# Patient Record
Sex: Male | Born: 1935 | Race: White | Hispanic: No | Smoking: Never smoker
Health system: Southern US, Community
[De-identification: ages and names within clinical notes are randomized; demographics above are authoritative.]

## PROBLEM LIST (undated history)

## (undated) DIAGNOSIS — F419 Anxiety disorder, unspecified: Secondary | ICD-10-CM

## (undated) DIAGNOSIS — M1990 Unspecified osteoarthritis, unspecified site: Secondary | ICD-10-CM

## (undated) DIAGNOSIS — R55 Syncope and collapse: Secondary | ICD-10-CM

## (undated) DIAGNOSIS — K219 Gastro-esophageal reflux disease without esophagitis: Secondary | ICD-10-CM

## (undated) DIAGNOSIS — I358 Other nonrheumatic aortic valve disorders: Secondary | ICD-10-CM

## (undated) DIAGNOSIS — R001 Bradycardia, unspecified: Secondary | ICD-10-CM

## (undated) DIAGNOSIS — R5381 Other malaise: Secondary | ICD-10-CM

## (undated) DIAGNOSIS — F0390 Unspecified dementia without behavioral disturbance: Secondary | ICD-10-CM

## (undated) DIAGNOSIS — M545 Low back pain, unspecified: Secondary | ICD-10-CM

## (undated) DIAGNOSIS — I340 Nonrheumatic mitral (valve) insufficiency: Secondary | ICD-10-CM

## (undated) DIAGNOSIS — Z951 Presence of aortocoronary bypass graft: Secondary | ICD-10-CM

## (undated) DIAGNOSIS — K2100 Gastro-esophageal reflux disease with esophagitis, without bleeding: Secondary | ICD-10-CM

## (undated) DIAGNOSIS — E785 Hyperlipidemia, unspecified: Secondary | ICD-10-CM

## (undated) DIAGNOSIS — L02419 Cutaneous abscess of limb, unspecified: Secondary | ICD-10-CM

## (undated) DIAGNOSIS — R609 Edema, unspecified: Secondary | ICD-10-CM

## (undated) DIAGNOSIS — L03119 Cellulitis of unspecified part of limb: Secondary | ICD-10-CM

## (undated) DIAGNOSIS — I779 Disorder of arteries and arterioles, unspecified: Secondary | ICD-10-CM

## (undated) DIAGNOSIS — M5412 Radiculopathy, cervical region: Secondary | ICD-10-CM

## (undated) DIAGNOSIS — R269 Unspecified abnormalities of gait and mobility: Secondary | ICD-10-CM

## (undated) DIAGNOSIS — K5900 Constipation, unspecified: Secondary | ICD-10-CM

## (undated) DIAGNOSIS — IMO0002 Reserved for concepts with insufficient information to code with codable children: Secondary | ICD-10-CM

## (undated) DIAGNOSIS — I739 Peripheral vascular disease, unspecified: Secondary | ICD-10-CM

## (undated) DIAGNOSIS — I2510 Atherosclerotic heart disease of native coronary artery without angina pectoris: Secondary | ICD-10-CM

## (undated) DIAGNOSIS — Z77090 Contact with and (suspected) exposure to asbestos: Secondary | ICD-10-CM

## (undated) DIAGNOSIS — K222 Esophageal obstruction: Secondary | ICD-10-CM

## (undated) DIAGNOSIS — I452 Bifascicular block: Secondary | ICD-10-CM

## (undated) DIAGNOSIS — R9430 Abnormal result of cardiovascular function study, unspecified: Secondary | ICD-10-CM

## (undated) HISTORY — DX: Contact with and (suspected) exposure to asbestos: Z77.090

## (undated) HISTORY — DX: Radiculopathy, cervical region: M54.12

## (undated) HISTORY — DX: Gastro-esophageal reflux disease with esophagitis, without bleeding: K21.00

## (undated) HISTORY — DX: Hyperlipidemia, unspecified: E78.5

## (undated) HISTORY — PX: TRANSURETHRAL RESECTION OF PROSTATE: SHX73

## (undated) HISTORY — DX: Unspecified dementia, unspecified severity, without behavioral disturbance, psychotic disturbance, mood disturbance, and anxiety: F03.90

## (undated) HISTORY — DX: Cutaneous abscess of limb, unspecified: L02.419

## (undated) HISTORY — DX: Esophageal obstruction: K22.2

## (undated) HISTORY — DX: Anxiety disorder, unspecified: F41.9

## (undated) HISTORY — DX: Disorder of arteries and arterioles, unspecified: I77.9

## (undated) HISTORY — DX: Constipation, unspecified: K59.00

## (undated) HISTORY — DX: Cellulitis of unspecified part of limb: L03.119

## (undated) HISTORY — DX: Peripheral vascular disease, unspecified: I73.9

## (undated) HISTORY — DX: Reserved for concepts with insufficient information to code with codable children: IMO0002

## (undated) HISTORY — DX: Other nonrheumatic aortic valve disorders: I35.8

## (undated) HISTORY — DX: Edema, unspecified: R60.9

## (undated) HISTORY — PX: OTHER SURGICAL HISTORY: SHX169

## (undated) HISTORY — DX: Atherosclerotic heart disease of native coronary artery without angina pectoris: I25.10

## (undated) HISTORY — DX: Other malaise: R53.81

## (undated) HISTORY — DX: Gastro-esophageal reflux disease with esophagitis: K21.0

## (undated) HISTORY — DX: Abnormal result of cardiovascular function study, unspecified: R94.30

## (undated) HISTORY — DX: Syncope and collapse: R55

## (undated) HISTORY — DX: Bifascicular block: I45.2

## (undated) HISTORY — DX: Presence of aortocoronary bypass graft: Z95.1

## (undated) HISTORY — DX: Gastro-esophageal reflux disease without esophagitis: K21.9

## (undated) HISTORY — DX: Nonrheumatic mitral (valve) insufficiency: I34.0

## (undated) HISTORY — DX: Unspecified osteoarthritis, unspecified site: M19.90

## (undated) HISTORY — DX: Low back pain, unspecified: M54.50

## (undated) HISTORY — DX: Bradycardia, unspecified: R00.1

## (undated) HISTORY — DX: Unspecified abnormalities of gait and mobility: R26.9

## (undated) HISTORY — DX: Low back pain: M54.5

---

## 1983-01-06 HISTORY — PX: CORONARY ARTERY BYPASS GRAFT: SHX141

## 1997-04-07 ENCOUNTER — Encounter (HOSPITAL_COMMUNITY): Admission: RE | Admit: 1997-04-07 | Discharge: 1997-07-06 | Payer: Self-pay | Admitting: *Deleted

## 1997-07-02 ENCOUNTER — Inpatient Hospital Stay (HOSPITAL_COMMUNITY): Admission: EM | Admit: 1997-07-02 | Discharge: 1997-07-02 | Payer: Self-pay | Admitting: Emergency Medicine

## 1997-07-05 ENCOUNTER — Encounter (HOSPITAL_COMMUNITY): Admission: RE | Admit: 1997-07-05 | Discharge: 1997-10-03 | Payer: Self-pay | Admitting: *Deleted

## 1997-10-05 ENCOUNTER — Encounter (HOSPITAL_COMMUNITY): Admission: RE | Admit: 1997-10-05 | Discharge: 1998-01-03 | Payer: Self-pay | Admitting: *Deleted

## 1998-01-04 ENCOUNTER — Encounter (HOSPITAL_COMMUNITY): Admission: RE | Admit: 1998-01-04 | Discharge: 1998-04-04 | Payer: Self-pay | Admitting: *Deleted

## 1998-04-05 ENCOUNTER — Encounter (HOSPITAL_COMMUNITY): Admission: RE | Admit: 1998-04-05 | Discharge: 1998-07-04 | Payer: Self-pay | Admitting: *Deleted

## 1998-07-05 ENCOUNTER — Encounter (HOSPITAL_COMMUNITY): Admission: RE | Admit: 1998-07-05 | Discharge: 1998-08-05 | Payer: Self-pay | Admitting: *Deleted

## 1998-08-06 ENCOUNTER — Encounter (HOSPITAL_COMMUNITY): Admission: RE | Admit: 1998-08-06 | Discharge: 1998-11-04 | Payer: Self-pay | Admitting: *Deleted

## 1998-08-27 ENCOUNTER — Encounter: Payer: Self-pay | Admitting: Gastroenterology

## 1999-01-06 HISTORY — PX: OTHER SURGICAL HISTORY: SHX169

## 1999-02-20 ENCOUNTER — Ambulatory Visit (HOSPITAL_COMMUNITY): Admission: RE | Admit: 1999-02-20 | Discharge: 1999-02-21 | Payer: Self-pay

## 2001-12-27 ENCOUNTER — Ambulatory Visit (HOSPITAL_COMMUNITY): Admission: RE | Admit: 2001-12-27 | Discharge: 2001-12-27 | Payer: Self-pay | Admitting: Rheumatology

## 2001-12-27 ENCOUNTER — Encounter: Payer: Self-pay | Admitting: Rheumatology

## 2002-01-05 DIAGNOSIS — K222 Esophageal obstruction: Secondary | ICD-10-CM

## 2002-01-05 HISTORY — DX: Esophageal obstruction: K22.2

## 2002-04-06 HISTORY — PX: OTHER SURGICAL HISTORY: SHX169

## 2002-04-20 ENCOUNTER — Encounter: Payer: Self-pay | Admitting: Orthopedic Surgery

## 2002-04-21 ENCOUNTER — Ambulatory Visit (HOSPITAL_COMMUNITY): Admission: RE | Admit: 2002-04-21 | Discharge: 2002-04-21 | Payer: Self-pay | Admitting: Pulmonary Disease

## 2002-04-21 ENCOUNTER — Encounter: Payer: Self-pay | Admitting: Pulmonary Disease

## 2002-04-24 ENCOUNTER — Inpatient Hospital Stay (HOSPITAL_COMMUNITY): Admission: RE | Admit: 2002-04-24 | Discharge: 2002-04-28 | Payer: Self-pay | Admitting: Orthopedic Surgery

## 2002-04-24 ENCOUNTER — Encounter: Payer: Self-pay | Admitting: Orthopedic Surgery

## 2002-05-01 ENCOUNTER — Emergency Department (HOSPITAL_COMMUNITY): Admission: EM | Admit: 2002-05-01 | Discharge: 2002-05-01 | Payer: Self-pay | Admitting: Emergency Medicine

## 2002-05-01 ENCOUNTER — Encounter: Payer: Self-pay | Admitting: Emergency Medicine

## 2002-05-01 ENCOUNTER — Inpatient Hospital Stay (HOSPITAL_COMMUNITY): Admission: EM | Admit: 2002-05-01 | Discharge: 2002-05-11 | Payer: Self-pay | Admitting: *Deleted

## 2002-05-02 ENCOUNTER — Encounter: Payer: Self-pay | Admitting: Cardiology

## 2002-05-02 ENCOUNTER — Encounter: Payer: Self-pay | Admitting: *Deleted

## 2002-05-03 ENCOUNTER — Encounter: Payer: Self-pay | Admitting: Orthopedic Surgery

## 2002-05-04 ENCOUNTER — Encounter: Payer: Self-pay | Admitting: Infectious Diseases

## 2002-05-04 ENCOUNTER — Encounter: Payer: Self-pay | Admitting: Gastroenterology

## 2004-01-21 ENCOUNTER — Ambulatory Visit: Payer: Self-pay | Admitting: *Deleted

## 2004-01-29 ENCOUNTER — Ambulatory Visit: Payer: Self-pay | Admitting: *Deleted

## 2004-02-13 ENCOUNTER — Ambulatory Visit: Payer: Self-pay

## 2004-04-08 ENCOUNTER — Ambulatory Visit: Payer: Self-pay | Admitting: Pulmonary Disease

## 2004-05-08 ENCOUNTER — Ambulatory Visit: Payer: Self-pay | Admitting: Gastroenterology

## 2004-06-03 ENCOUNTER — Ambulatory Visit: Payer: Self-pay | Admitting: *Deleted

## 2004-07-23 ENCOUNTER — Ambulatory Visit: Payer: Self-pay | Admitting: *Deleted

## 2004-11-13 ENCOUNTER — Ambulatory Visit: Payer: Self-pay | Admitting: Pulmonary Disease

## 2005-01-12 ENCOUNTER — Ambulatory Visit: Payer: Self-pay | Admitting: *Deleted

## 2005-01-20 ENCOUNTER — Ambulatory Visit: Payer: Self-pay | Admitting: *Deleted

## 2005-02-20 ENCOUNTER — Ambulatory Visit: Payer: Self-pay

## 2005-04-06 ENCOUNTER — Ambulatory Visit: Payer: Self-pay | Admitting: Pulmonary Disease

## 2005-04-15 ENCOUNTER — Ambulatory Visit: Payer: Self-pay | Admitting: Gastroenterology

## 2005-05-07 ENCOUNTER — Ambulatory Visit: Payer: Self-pay | Admitting: Gastroenterology

## 2005-06-30 ENCOUNTER — Ambulatory Visit: Payer: Self-pay | Admitting: *Deleted

## 2005-07-06 ENCOUNTER — Ambulatory Visit: Payer: Self-pay | Admitting: *Deleted

## 2005-10-27 ENCOUNTER — Ambulatory Visit: Payer: Self-pay | Admitting: *Deleted

## 2005-10-27 LAB — CONVERTED CEMR LAB
AST: 25 units/L (ref 0–37)
Albumin: 3.8 g/dL (ref 3.5–5.2)
Alkaline Phosphatase: 47 units/L (ref 39–117)
Chol/HDL Ratio, serum: 3.8
Creatinine, Ser: 1.1 mg/dL (ref 0.4–1.5)
GFR calc non Af Amer: 67 mL/min
HDL: 46.4 mg/dL (ref >39.0)
LDL Cholesterol: 117 mg/dL — ABNORMAL HIGH (ref 0–99)
Potassium: 4.1 meq/L (ref 3.5–5.1)
Sodium: 141 meq/L (ref 135–145)
Total Bilirubin: 1 mg/dL (ref 0.3–1.2)
Triglyceride fasting, serum: 61 mg/dL (ref 0–149)
VLDL: 12 mg/dL (ref 0–40)

## 2005-11-10 ENCOUNTER — Ambulatory Visit: Payer: Self-pay | Admitting: *Deleted

## 2005-12-08 ENCOUNTER — Ambulatory Visit: Payer: Self-pay | Admitting: *Deleted

## 2005-12-23 ENCOUNTER — Ambulatory Visit: Payer: Self-pay

## 2006-01-27 ENCOUNTER — Ambulatory Visit: Payer: Self-pay | Admitting: Pulmonary Disease

## 2006-02-10 ENCOUNTER — Ambulatory Visit: Payer: Self-pay | Admitting: *Deleted

## 2006-02-10 ENCOUNTER — Ambulatory Visit: Payer: Self-pay

## 2006-02-10 LAB — CONVERTED CEMR LAB
ALT: 27 U/L
AST: 30 U/L
Albumin: 3.5 g/dL
Alkaline Phosphatase: 54 U/L
Bilirubin, Direct: 0.2 mg/dL
Cholesterol: 136 mg/dL
HDL: 46 mg/dL
LDL Cholesterol: 81 mg/dL
Total Bilirubin: 0.9 mg/dL
Total CHOL/HDL Ratio: 3
Total Protein: 6.5 g/dL
Triglycerides: 44 mg/dL
VLDL: 9 mg/dL

## 2006-04-29 ENCOUNTER — Ambulatory Visit: Payer: Self-pay | Admitting: *Deleted

## 2006-04-29 LAB — CONVERTED CEMR LAB
BUN: 19 mg/dL (ref 6–23)
GFR calc non Af Amer: 67 mL/min
Glucose, Bld: 70 mg/dL (ref 70–99)
Sodium: 140 meq/L (ref 135–145)

## 2006-07-27 ENCOUNTER — Ambulatory Visit: Payer: Self-pay | Admitting: *Deleted

## 2006-07-27 LAB — CONVERTED CEMR LAB
ALT: 25 units/L (ref 0–53)
AST: 26 units/L (ref 0–37)
Albumin: 3.8 g/dL (ref 3.5–5.2)
Bilirubin, Direct: 0.2 mg/dL (ref 0.0–0.3)
Cholesterol: 134 mg/dL (ref 0–200)
HDL: 46.9 mg/dL (ref >39.0)
LDL Cholesterol: 77 mg/dL (ref 0–99)
Total Bilirubin: 1.3 mg/dL — ABNORMAL HIGH (ref 0.3–1.2)
VLDL: 10 mg/dL (ref 0–40)

## 2006-08-05 ENCOUNTER — Ambulatory Visit: Payer: Self-pay | Admitting: Cardiology

## 2006-11-02 ENCOUNTER — Ambulatory Visit: Payer: Self-pay | Admitting: Cardiology

## 2007-01-07 DIAGNOSIS — M1990 Unspecified osteoarthritis, unspecified site: Secondary | ICD-10-CM | POA: Insufficient documentation

## 2007-01-07 DIAGNOSIS — E785 Hyperlipidemia, unspecified: Secondary | ICD-10-CM

## 2007-01-10 ENCOUNTER — Ambulatory Visit: Payer: Self-pay | Admitting: Pulmonary Disease

## 2007-01-10 DIAGNOSIS — F411 Generalized anxiety disorder: Secondary | ICD-10-CM | POA: Insufficient documentation

## 2007-01-10 DIAGNOSIS — J309 Allergic rhinitis, unspecified: Secondary | ICD-10-CM | POA: Insufficient documentation

## 2007-01-10 DIAGNOSIS — M545 Low back pain: Secondary | ICD-10-CM

## 2007-01-10 DIAGNOSIS — R109 Unspecified abdominal pain: Secondary | ICD-10-CM

## 2007-01-10 DIAGNOSIS — R972 Elevated prostate specific antigen [PSA]: Secondary | ICD-10-CM | POA: Insufficient documentation

## 2007-01-24 ENCOUNTER — Ambulatory Visit: Payer: Self-pay | Admitting: Pulmonary Disease

## 2007-01-24 DIAGNOSIS — D126 Benign neoplasm of colon, unspecified: Secondary | ICD-10-CM

## 2007-01-24 DIAGNOSIS — K5730 Diverticulosis of large intestine without perforation or abscess without bleeding: Secondary | ICD-10-CM | POA: Insufficient documentation

## 2007-01-24 DIAGNOSIS — K209 Esophagitis, unspecified without bleeding: Secondary | ICD-10-CM | POA: Insufficient documentation

## 2007-01-24 DIAGNOSIS — R1314 Dysphagia, pharyngoesophageal phase: Secondary | ICD-10-CM

## 2007-01-24 DIAGNOSIS — Z77090 Contact with and (suspected) exposure to asbestos: Secondary | ICD-10-CM

## 2007-01-25 DIAGNOSIS — M810 Age-related osteoporosis without current pathological fracture: Secondary | ICD-10-CM | POA: Insufficient documentation

## 2007-01-25 LAB — CONVERTED CEMR LAB
BUN: 21 mg/dL (ref 6–23)
Basophils Absolute: 0.1 10*3/uL (ref 0.0–0.1)
Bilirubin, Direct: 0.2 mg/dL (ref 0.0–0.3)
CO2: 30 meq/L (ref 19–32)
Calcium: 8.9 mg/dL (ref 8.4–10.5)
Cholesterol: 137 mg/dL (ref 0–200)
Creatinine, Ser: 1 mg/dL (ref 0.4–1.5)
Eosinophils Relative: 2 % (ref 0.0–5.0)
Glucose, Bld: 76 mg/dL (ref 70–99)
HCT: 45 % (ref 39.0–52.0)
Monocytes Absolute: 0.9 10*3/uL — ABNORMAL HIGH (ref 0.2–0.7)
Neutro Abs: 4.5 10*3/uL (ref 1.4–7.7)
Neutrophils Relative %: 58.4 % (ref 43.0–77.0)
Potassium: 4.1 meq/L (ref 3.5–5.1)
Sodium: 136 meq/L (ref 135–145)
TSH: 1.18 microintl units/mL (ref 0.35–5.50)
Total CHOL/HDL Ratio: 2.8
VLDL: 9 mg/dL (ref 0–40)

## 2007-01-27 ENCOUNTER — Ambulatory Visit: Payer: Self-pay | Admitting: Cardiology

## 2007-02-08 ENCOUNTER — Encounter: Payer: Self-pay | Admitting: Adult Health

## 2007-02-17 ENCOUNTER — Ambulatory Visit: Payer: Self-pay | Admitting: Pulmonary Disease

## 2007-02-21 ENCOUNTER — Ambulatory Visit: Payer: Self-pay | Admitting: Pulmonary Disease

## 2007-03-09 ENCOUNTER — Encounter: Payer: Self-pay | Admitting: Pulmonary Disease

## 2007-03-29 ENCOUNTER — Encounter: Payer: Self-pay | Admitting: Pulmonary Disease

## 2007-04-27 ENCOUNTER — Ambulatory Visit: Payer: Self-pay | Admitting: Cardiology

## 2007-05-24 ENCOUNTER — Ambulatory Visit: Payer: Self-pay | Admitting: Internal Medicine

## 2007-05-26 ENCOUNTER — Telehealth: Payer: Self-pay | Admitting: Adult Health

## 2007-07-20 ENCOUNTER — Encounter: Payer: Self-pay | Admitting: Pulmonary Disease

## 2007-08-29 ENCOUNTER — Encounter: Payer: Self-pay | Admitting: Pulmonary Disease

## 2007-09-28 ENCOUNTER — Ambulatory Visit: Payer: Self-pay | Admitting: Cardiology

## 2007-10-05 ENCOUNTER — Ambulatory Visit: Payer: Self-pay | Admitting: Cardiology

## 2007-10-05 LAB — CONVERTED CEMR LAB
AST: 29 units/L (ref 0–37)
HDL: 46.7 mg/dL (ref >39.0)
Total Protein: 6.5 g/dL (ref 6.0–8.3)
Triglycerides: 49 mg/dL (ref 0–149)
Vitamin B-12: 707 pg/mL (ref 211–911)

## 2007-10-10 ENCOUNTER — Telehealth: Payer: Self-pay | Admitting: Gastroenterology

## 2007-10-10 ENCOUNTER — Telehealth: Payer: Self-pay | Admitting: Pulmonary Disease

## 2007-10-12 ENCOUNTER — Ambulatory Visit: Payer: Self-pay | Admitting: Gastroenterology

## 2007-10-12 LAB — CONVERTED CEMR LAB
ALT: 29 units/L (ref 0–53)
Alkaline Phosphatase: 59 units/L (ref 39–117)
CO2: 34 meq/L — ABNORMAL HIGH (ref 19–32)
Calcium: 8.7 mg/dL (ref 8.4–10.5)
Eosinophils Relative: 1.4 % (ref 0.0–5.0)
GFR calc Af Amer: 81 mL/min
HCT: 43.8 % (ref 39.0–52.0)
Hemoglobin: 15.3 g/dL (ref 13.0–17.0)
Lymphocytes Relative: 24.6 % (ref 12.0–46.0)
MCHC: 34.8 g/dL (ref 30.0–36.0)
MCV: 96.9 fL (ref 78.0–100.0)
Monocytes Absolute: 0.9 10*3/uL (ref 0.1–1.0)
Platelets: 171 10*3/uL (ref 150–400)
Potassium: 4 meq/L (ref 3.5–5.1)
RBC: 4.52 M/uL (ref 4.22–5.81)
Sed Rate: 7 mm/hr (ref 0–16)
Total Protein: 6.9 g/dL (ref 6.0–8.3)
WBC: 6.7 10*3/uL (ref 4.5–10.5)

## 2007-10-14 ENCOUNTER — Ambulatory Visit: Payer: Self-pay

## 2007-10-14 ENCOUNTER — Telehealth: Payer: Self-pay | Admitting: Gastroenterology

## 2007-10-14 ENCOUNTER — Encounter: Payer: Self-pay | Admitting: Gastroenterology

## 2007-10-17 ENCOUNTER — Telehealth: Payer: Self-pay | Admitting: Gastroenterology

## 2007-10-18 ENCOUNTER — Ambulatory Visit: Payer: Self-pay | Admitting: Cardiology

## 2007-10-21 ENCOUNTER — Telehealth: Payer: Self-pay | Admitting: Gastroenterology

## 2007-11-10 ENCOUNTER — Ambulatory Visit: Payer: Self-pay | Admitting: Cardiology

## 2007-12-06 ENCOUNTER — Ambulatory Visit: Payer: Self-pay | Admitting: Cardiology

## 2007-12-21 ENCOUNTER — Ambulatory Visit: Payer: Self-pay | Admitting: Pulmonary Disease

## 2007-12-22 ENCOUNTER — Emergency Department (HOSPITAL_COMMUNITY): Admission: EM | Admit: 2007-12-22 | Discharge: 2007-12-22 | Payer: Self-pay | Admitting: Emergency Medicine

## 2008-02-06 ENCOUNTER — Ambulatory Visit: Payer: Self-pay | Admitting: Gastroenterology

## 2008-02-08 ENCOUNTER — Telehealth (INDEPENDENT_AMBULATORY_CARE_PROVIDER_SITE_OTHER): Payer: Self-pay | Admitting: *Deleted

## 2008-04-04 ENCOUNTER — Encounter: Payer: Self-pay | Admitting: Cardiology

## 2008-04-04 ENCOUNTER — Ambulatory Visit: Payer: Self-pay | Admitting: Cardiology

## 2008-04-04 DIAGNOSIS — I959 Hypotension, unspecified: Secondary | ICD-10-CM

## 2008-04-04 DIAGNOSIS — I4510 Unspecified right bundle-branch block: Secondary | ICD-10-CM | POA: Insufficient documentation

## 2008-04-18 ENCOUNTER — Ambulatory Visit: Payer: Self-pay | Admitting: Pulmonary Disease

## 2008-04-18 DIAGNOSIS — Z87898 Personal history of other specified conditions: Secondary | ICD-10-CM

## 2008-04-24 ENCOUNTER — Ambulatory Visit: Payer: Self-pay | Admitting: Pulmonary Disease

## 2008-04-29 LAB — CONVERTED CEMR LAB
ALT: 31 units/L (ref 0–53)
Albumin: 3.7 g/dL (ref 3.5–5.2)
Alkaline Phosphatase: 52 units/L (ref 39–117)
BUN: 17 mg/dL (ref 6–23)
Calcium: 9.2 mg/dL (ref 8.4–10.5)
Chloride: 103 meq/L (ref 96–112)
Cholesterol: 170 mg/dL (ref 0–200)
Creatinine, Ser: 1 mg/dL (ref 0.4–1.5)
GFR calc non Af Amer: 74.59 mL/min (ref >60)
HCT: 42.7 % (ref 39.0–52.0)
Hemoglobin: 15 g/dL (ref 13.0–17.0)
Lymphocytes Relative: 26.8 % (ref 12.0–46.0)
Neutrophils Relative %: 56.2 % (ref 43.0–77.0)
PSA: 2.52 ng/mL (ref 0.10–4.00)
Sodium: 141 meq/L (ref 135–145)
TSH: 1.29 microintl units/mL (ref 0.35–5.50)
Total Bilirubin: 1.1 mg/dL (ref 0.3–1.2)
Total CHOL/HDL Ratio: 3
WBC: 5.6 10*3/uL (ref 4.5–10.5)

## 2008-05-04 ENCOUNTER — Telehealth: Payer: Self-pay | Admitting: Pulmonary Disease

## 2008-07-13 ENCOUNTER — Telehealth (INDEPENDENT_AMBULATORY_CARE_PROVIDER_SITE_OTHER): Payer: Self-pay | Admitting: *Deleted

## 2008-07-23 ENCOUNTER — Telehealth: Payer: Self-pay | Admitting: Gastroenterology

## 2008-07-25 ENCOUNTER — Ambulatory Visit: Payer: Self-pay | Admitting: Internal Medicine

## 2008-07-25 DIAGNOSIS — K5900 Constipation, unspecified: Secondary | ICD-10-CM | POA: Insufficient documentation

## 2008-07-25 DIAGNOSIS — K219 Gastro-esophageal reflux disease without esophagitis: Secondary | ICD-10-CM

## 2008-07-25 DIAGNOSIS — K222 Esophageal obstruction: Secondary | ICD-10-CM

## 2008-08-20 ENCOUNTER — Telehealth: Payer: Self-pay | Admitting: Gastroenterology

## 2008-08-23 ENCOUNTER — Ambulatory Visit: Payer: Self-pay | Admitting: Gastroenterology

## 2008-08-24 ENCOUNTER — Ambulatory Visit: Payer: Self-pay | Admitting: Gastroenterology

## 2008-08-27 ENCOUNTER — Telehealth: Payer: Self-pay | Admitting: Gastroenterology

## 2008-08-28 ENCOUNTER — Telehealth: Payer: Self-pay | Admitting: Gastroenterology

## 2008-09-03 LAB — CONVERTED CEMR LAB: Fecal Occult Bld: POSITIVE

## 2008-09-04 ENCOUNTER — Encounter: Payer: Self-pay | Admitting: Cardiology

## 2008-09-05 ENCOUNTER — Ambulatory Visit: Payer: Self-pay | Admitting: Cardiology

## 2008-09-06 ENCOUNTER — Encounter: Payer: Self-pay | Admitting: Gastroenterology

## 2008-09-06 ENCOUNTER — Telehealth: Payer: Self-pay | Admitting: Gastroenterology

## 2008-09-11 ENCOUNTER — Ambulatory Visit: Payer: Self-pay | Admitting: Gastroenterology

## 2008-09-11 ENCOUNTER — Telehealth: Payer: Self-pay | Admitting: Gastroenterology

## 2008-09-11 LAB — CONVERTED CEMR LAB: Fecal Occult Bld: NEGATIVE

## 2008-09-21 ENCOUNTER — Telehealth: Payer: Self-pay | Admitting: Gastroenterology

## 2008-09-26 ENCOUNTER — Telehealth: Payer: Self-pay | Admitting: Gastroenterology

## 2008-10-05 ENCOUNTER — Ambulatory Visit: Payer: Self-pay | Admitting: Pulmonary Disease

## 2008-10-08 ENCOUNTER — Telehealth: Payer: Self-pay | Admitting: Gastroenterology

## 2008-10-15 ENCOUNTER — Telehealth (INDEPENDENT_AMBULATORY_CARE_PROVIDER_SITE_OTHER): Payer: Self-pay | Admitting: *Deleted

## 2008-10-18 ENCOUNTER — Telehealth: Payer: Self-pay | Admitting: Cardiology

## 2008-10-22 ENCOUNTER — Ambulatory Visit: Payer: Self-pay | Admitting: Pulmonary Disease

## 2008-10-25 ENCOUNTER — Ambulatory Visit: Payer: Self-pay | Admitting: Gastroenterology

## 2008-10-29 ENCOUNTER — Telehealth: Payer: Self-pay | Admitting: Gastroenterology

## 2008-11-05 ENCOUNTER — Telehealth: Payer: Self-pay | Admitting: Gastroenterology

## 2008-12-14 ENCOUNTER — Telehealth: Payer: Self-pay | Admitting: Gastroenterology

## 2009-01-07 ENCOUNTER — Telehealth: Payer: Self-pay | Admitting: Cardiology

## 2009-02-01 ENCOUNTER — Telehealth: Payer: Self-pay | Admitting: Cardiology

## 2009-03-28 ENCOUNTER — Ambulatory Visit: Payer: Self-pay | Admitting: Cardiology

## 2009-04-19 ENCOUNTER — Telehealth: Payer: Self-pay | Admitting: Pulmonary Disease

## 2009-04-22 ENCOUNTER — Ambulatory Visit: Payer: Self-pay | Admitting: Pulmonary Disease

## 2009-04-29 ENCOUNTER — Ambulatory Visit: Payer: Self-pay | Admitting: Pulmonary Disease

## 2009-04-29 LAB — CONVERTED CEMR LAB
ALT: 28 units/L (ref 0–53)
AST: 26 units/L (ref 0–37)
Alkaline Phosphatase: 47 units/L (ref 39–117)
Basophils Absolute: 0 10*3/uL (ref 0.0–0.1)
Basophils Relative: 0.3 % (ref 0.0–3.0)
Bilirubin, Direct: 0.1 mg/dL (ref 0.0–0.3)
CO2: 31 meq/L (ref 19–32)
Calcium: 9.1 mg/dL (ref 8.4–10.5)
Chloride: 104 meq/L (ref 96–112)
Creatinine, Ser: 1.1 mg/dL (ref 0.4–1.5)
Eosinophils Absolute: 0.2 10*3/uL (ref 0.0–0.7)
GFR calc non Af Amer: 66.68 mL/min (ref >60)
MCV: 97.4 fL (ref 78.0–100.0)
Monocytes Absolute: 0.9 10*3/uL (ref 0.1–1.0)
Monocytes Relative: 12.6 % — ABNORMAL HIGH (ref 3.0–12.0)
Neutro Abs: 4.1 10*3/uL (ref 1.4–7.7)
PSA: 3.17 ng/mL (ref 0.10–4.00)
TSH: 1.68 microintl units/mL (ref 0.35–5.50)
Total CHOL/HDL Ratio: 3
Triglycerides: 49 mg/dL (ref 0.0–149.0)
WBC: 7.3 10*3/uL (ref 4.5–10.5)

## 2009-05-14 ENCOUNTER — Encounter: Payer: Self-pay | Admitting: Pulmonary Disease

## 2009-05-26 ENCOUNTER — Inpatient Hospital Stay (HOSPITAL_COMMUNITY): Admission: EM | Admit: 2009-05-26 | Discharge: 2009-05-27 | Payer: Self-pay | Admitting: Emergency Medicine

## 2009-05-26 ENCOUNTER — Ambulatory Visit: Payer: Self-pay | Admitting: Internal Medicine

## 2009-05-26 ENCOUNTER — Encounter: Payer: Self-pay | Admitting: Cardiology

## 2009-05-27 ENCOUNTER — Encounter: Payer: Self-pay | Admitting: Cardiology

## 2009-05-31 ENCOUNTER — Encounter: Payer: Self-pay | Admitting: Cardiology

## 2009-06-04 ENCOUNTER — Ambulatory Visit: Payer: Self-pay | Admitting: Cardiology

## 2009-06-14 ENCOUNTER — Telehealth: Payer: Self-pay | Admitting: Cardiology

## 2009-06-17 ENCOUNTER — Telehealth: Payer: Self-pay | Admitting: Adult Health

## 2009-06-18 ENCOUNTER — Ambulatory Visit: Payer: Self-pay | Admitting: Pulmonary Disease

## 2009-06-24 ENCOUNTER — Telehealth (INDEPENDENT_AMBULATORY_CARE_PROVIDER_SITE_OTHER): Payer: Self-pay | Admitting: *Deleted

## 2009-06-25 ENCOUNTER — Encounter: Payer: Self-pay | Admitting: Pulmonary Disease

## 2009-08-25 ENCOUNTER — Emergency Department (HOSPITAL_COMMUNITY): Admission: EM | Admit: 2009-08-25 | Discharge: 2009-08-26 | Payer: Self-pay | Admitting: Emergency Medicine

## 2009-09-04 ENCOUNTER — Telehealth: Payer: Self-pay | Admitting: Pulmonary Disease

## 2009-09-04 ENCOUNTER — Ambulatory Visit: Payer: Self-pay | Admitting: Cardiology

## 2009-09-05 ENCOUNTER — Ambulatory Visit: Payer: Self-pay | Admitting: Pulmonary Disease

## 2009-09-19 ENCOUNTER — Telehealth (INDEPENDENT_AMBULATORY_CARE_PROVIDER_SITE_OTHER): Payer: Self-pay | Admitting: *Deleted

## 2009-09-19 DIAGNOSIS — R5381 Other malaise: Secondary | ICD-10-CM

## 2009-09-19 DIAGNOSIS — R5383 Other fatigue: Secondary | ICD-10-CM

## 2009-09-26 ENCOUNTER — Ambulatory Visit: Payer: Self-pay | Admitting: Vascular Surgery

## 2009-09-26 ENCOUNTER — Encounter: Payer: Self-pay | Admitting: Cardiology

## 2009-09-26 ENCOUNTER — Encounter: Payer: Self-pay | Admitting: Pulmonary Disease

## 2009-09-26 ENCOUNTER — Ambulatory Visit: Payer: Self-pay

## 2009-09-27 ENCOUNTER — Encounter: Payer: Self-pay | Admitting: Pulmonary Disease

## 2009-10-03 ENCOUNTER — Telehealth: Payer: Self-pay | Admitting: Cardiology

## 2009-10-21 ENCOUNTER — Telehealth (INDEPENDENT_AMBULATORY_CARE_PROVIDER_SITE_OTHER): Payer: Self-pay | Admitting: *Deleted

## 2009-10-22 ENCOUNTER — Ambulatory Visit: Payer: Self-pay | Admitting: Adult Health

## 2009-10-22 ENCOUNTER — Encounter: Payer: Self-pay | Admitting: Adult Health

## 2009-10-22 ENCOUNTER — Ambulatory Visit: Payer: Self-pay | Admitting: Pulmonary Disease

## 2009-10-22 LAB — CONVERTED CEMR LAB
Bilirubin Urine: NEGATIVE
Nitrite: NEGATIVE
Urine Glucose: NEGATIVE mg/dL
Urobilinogen, UA: 0.2 (ref 0.0–1.0)

## 2009-10-24 ENCOUNTER — Encounter: Payer: Self-pay | Admitting: Pulmonary Disease

## 2009-10-25 ENCOUNTER — Telehealth: Payer: Self-pay | Admitting: Adult Health

## 2009-12-02 ENCOUNTER — Encounter: Payer: Self-pay | Admitting: Pulmonary Disease

## 2009-12-04 ENCOUNTER — Encounter: Payer: Self-pay | Admitting: Pulmonary Disease

## 2009-12-17 ENCOUNTER — Encounter: Payer: Self-pay | Admitting: Cardiology

## 2009-12-18 ENCOUNTER — Encounter: Payer: Self-pay | Admitting: Pulmonary Disease

## 2009-12-19 ENCOUNTER — Ambulatory Visit: Payer: Self-pay | Admitting: Cardiology

## 2010-01-23 ENCOUNTER — Ambulatory Visit: Admit: 2010-01-23 | Payer: Self-pay | Admitting: Pulmonary Disease

## 2010-01-26 ENCOUNTER — Encounter: Payer: Self-pay | Admitting: Pulmonary Disease

## 2010-01-29 ENCOUNTER — Ambulatory Visit
Admission: RE | Admit: 2010-01-29 | Discharge: 2010-01-29 | Payer: Self-pay | Source: Home / Self Care | Attending: Sports Medicine | Admitting: Sports Medicine

## 2010-01-29 DIAGNOSIS — M79609 Pain in unspecified limb: Secondary | ICD-10-CM | POA: Insufficient documentation

## 2010-02-04 ENCOUNTER — Encounter: Payer: Self-pay | Admitting: Pulmonary Disease

## 2010-02-04 NOTE — Miscellaneous (Signed)
Summary: DG CHEST LEFT DECUBITUS / CT CHEST W/O CM  DG CHEST LEFT DECUBITUS / CT CHEST W/O CM   Imported By: Roderic Ovens 06/25/2009 11:27:20  _____________________________________________________________________  External Attachment:    Type:   Image     Comment:   External Document

## 2010-02-04 NOTE — Assessment & Plan Note (Signed)
Summary: eph/jml  Medications Added COLACE 100 MG CAPS (DOCUSATE SODIUM) as directed      Allergies Added: NKDA  Visit Type:  Follow-up Primary Provider:  Alroy Dust, MD  CC:  pre-syncope.  History of Present Illness: the patient is seen post hospitalization with an episode of syncope.  He was at a friend's home for a Saturday evening dinner and fund raiser.  I happened to be there.  The patient had an difficult day with constipation and his hemorrhoids were irritated.  He did not feel well in general and then became diaphoretic.  His pulse was weak but present.  He had his usual resting bradycardia.  Related down to the floor and is pulse became more full.  His mentation had been slightly decreased in the sitting position and this returned to normal.  He did not have chest pain.  I decided that he should be taken to the hospital for observation and this was done by ambulance.  In the hospital he was monitored and had no more arrhythmias.  He was found to be significantly constipated.  This was treated and he is improved.  Further cardiac workup was not needed.  He is now on an excellent regimen for his GI tract..  Current Medications (verified): 1)  Clarinex 5 Mg Tabs (Desloratadine) .... Take 1 Tablet By Mouth Once A Day 2)  Bayer Low Strength 81 Mg  Tbec (Aspirin) .... Take 1 Tablet By Mouth Once A Day 3)  Isosorbide Mononitrate Cr 30 Mg Tb24 (Isosorbide Mononitrate) .... Take 1/2 Tablets By Mouth Two Times A Day 4)  Metoprolol Succinate 25 Mg Xr24h-Tab (Metoprolol Succinate) .... Take 1 Tab By Mouth Once Daily.Marland KitchenMarland Kitchen 5)  Lipitor 20 Mg Tabs (Atorvastatin Calcium) .... Take 1/2 Tablets By Mouth Once Daily 6)  Fish Oil   Oil (Fish Oil) .... 2000mg  Once Daily 7)  Co Q-10 200 Mg  Caps (Coenzyme Q10) .... Take 1 Tabs By Mouth Once Daily 8)  Align  Caps (Probiotic Product) .... One Tablet By Mouth Once Daily 9)  Miralax  Powd (Polyethylene Glycol 3350) .[name].. 1 Capful in Water Daily 10)   Glucosamine-Chondroitin-Msm 500-250-250 Mg  Caps (Glucosamine-Chondroitin-Msm) .[name].. 1 Tablet Three Times A Day 11)  Multivitamins   Tabs (Multiple Vitamin) .... Take 1 Tablet By Mouth Once A Day 12)  Vitamin D3 2000 Unit Caps (Cholecalciferol) .... Once Daily 13)  Ca Mag & Phosp .... Take 1 Tablet By Mouth 4 Times Per Day. 14)  Colace 100 Mg Caps (Docusate Sodium) .... As Directed  Allergies (verified): No Known Drug Allergies  Past History:  Past Medical History: ALLERGIC RHINITIS (ICD-477.9) HISTORY OF ASBESTOS EXPOSURE (ICD-V15.84) CAD (ICD-414.00).Marland KitchenMarland KitchenMyoview scan December 2 007.. question mild ischemia at the base of the anterolateral wall CABG 1985 - Echo 2004 EF 55-65%....  Right bundle branch block - Bradycardia- mild, tolerates low-dose beta blocker. CEREBROVASCULAR DISEASE (ICD-437.9 - Carotid artery disease.Marland KitchenMarland KitchenDoppler 2007.[name] 40-59% RICA-0-39% LICA GERD /HX OF ESOPHAGEAL STRICTURE2004 BENIGN PROSTATIC HYPERTROPHY, HX OF (ICD-V13.8) OSTEOARTHRITIS (ICD-715.90) OSTEOPOROSIS (ICD-733.00) LOW BACK PAIN SYNDROME (ICD-724.2) ANXIETY (ICD-300.00) Presyncope.....[facility]... May 26, 2009... probably vasovagal...discomfort from constipation  Vital Signs:  Patient profile:   77 year old male Height:      64 inches Weight:      131 pounds BMI:     22.57 Pulse rate:   67 / minute Resp:     16 per minute BP sitting:   126 / 68  (left arm)  Vitals Entered By: Marrion Coy, CNA (  Jun 04, 2009 3:52 PM)  Physical Exam  General:  patient is stable today. Eyes:  no xanthelasma. Neck:  no jugular venous extension. Lungs:  lungs are clear.  Respiratory effort is nonlabored. Heart:  cardiac exam reveals an S1-S2.  There are no clicks or significant murmurs Abdomen:  abdomen is soft. Extremities:  no peripheral edema. Psych:  patient is oriented to person time and place.  Affect is normal.   Impression & Recommendations:  Problem # 1:  * PRESYNCOPE We believe that this episode was  related to discomfort from his constipation and hemorrhoids. He is stable now.  No further workup.  Problem # 2:  CONSTIPATION (ICD-564.00) This is now improved.  Problem # 3:  RBBB (ICD-426.4)  His updated medication list for this problem includes:    Bayer Low Strength 81 Mg Tbec (Aspirin) .[name]... Take 1 tablet by mouth once a day    Isosorbide Mononitrate Cr 30 Mg Tb24 (Isosorbide mononitrate) .[name]... Take 1/2 tablets by mouth two times a day    Metoprolol Succinate 25 Mg Xr24h-tab (Metoprolol succinate) .[name]... Take 1 tab by mouth once daily... EKG is done today and shows right bundle branch block.  There is normal sinus rhythm.  Problem # 4:  CAD (ICD-414.00)  His updated medication list for this problem includes:    Bayer Low Strength 81 Mg Tbec (Aspirin) .[name]... Take 1 tablet by mouth once a day    Isosorbide Mononitrate Cr 30 Mg Tb24 (Isosorbide mononitrate) .[name]... Take 1/2 tablets by mouth two times a day    Metoprolol Succinate 25 Mg Xr24h-tab (Metoprolol succinate) .[name]... Take 1 tab by mouth once daily... Coronary disease is stable.  No further workup.  Other Orders: EKG w/ Interpretation (93000)  Patient Instructions: 1)  Follow up in 3 months

## 2010-02-04 NOTE — Miscellaneous (Signed)
Summary: Order & Plan of Care / [employer] [facility]  Order & Plan of Care / [facility]   Imported By: Lennie Odor 12/05/2009 16:22:28  _____________________________________________________________________  External Attachment:    Type:   Image     Comment:   External Document
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
## 2010-02-04 NOTE — Progress Notes (Signed)
Summary: pain  Phone Note Call from Patient   Caller: Patient Call For: nadel Summary of Call: pt have pain in left leg below the groin area Initial call taken by: Rickard Patience,  June 17, 2009 8:49 AM  Follow-up for Phone Call        Pt c/o sharp pain in left thight area when he bends his leg. this started yesterday. he denies any redness to the area, no knots or bruising either. Pt requesitng an appt tomorrow with SN of TP. Spoke to [employer]. and she ok placeing pt in 11 am slot. Pt aware of appt. Carron Curie CMA  June 17, 2009 9:46 AM

## 2010-02-04 NOTE — Progress Notes (Signed)
Summary: blood work   Phone Note Call from Patient Call back at [employer] [phone]   Caller: Patient Summary of Call: does he need blood work prior to appt, appt 3/24 Initial call taken by: Migdalia Dk,  February 01, 2009 2:48 PM  Follow-up for Phone Call        Dr Kriste Basque follows lipids, pt is aware Meredith Staggers, RN  February 01, 2009 3:45 PM
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
## 2010-02-04 NOTE — Miscellaneous (Signed)
  Clinical Lists Changes  Observations: Added new observation of ECHOINTERP:   - Left ventricle: The cavity size was normal. Wall thickness was     increased in a pattern of mild LVH. The estimated ejection     fraction was 65%. Wall motion was normal; there were no regional     wall motion abnormalities. Doppler parameters are consistent with     abnormal left ventricular relaxation (grade 1 diastolic     dysfunction).   - Aortic valve: Mild Sclerosis without stenosis. Mild regurgitation.   - Mitral valve: Mildly calcified annulus. Mild regurgitation.   - Pulmonary arteries: PA peak pressure: 39mm Hg (S).  (05/27/2009 12:15) Added new observation of CXR RESULTS:    Findings: No pneumothorax identified.  Skin folds are present over   the bilateral lung bases which may mimic pneumothorax.  Evidence of   calcified pleural plaques again noted.  Prior CABG.  Heart size   normal.  Flattening of hemidiaphragms likely signifies COPD.    IMPRESSION:   No pneumothorax identified.  No new acute finding.  (05/26/2009 12:15)      Echocardiogram  Procedure date:  05/27/2009  Findings:        - Left ventricle: The cavity size was normal. Wall thickness was     increased in a pattern of mild LVH. The estimated ejection     fraction was 65%. Wall motion was normal; there were no regional     wall motion abnormalities. Doppler parameters are consistent with     abnormal left ventricular relaxation (grade 1 diastolic     dysfunction).   - Aortic valve: Mild Sclerosis without stenosis. Mild regurgitation.   - Mitral valve: Mildly calcified annulus. Mild regurgitation.   - Pulmonary arteries: PA peak pressure: 39mm Hg (S).   CXR  Procedure date:  05/26/2009  Findings:         Findings: No pneumothorax identified.  Skin folds are present over   the bilateral lung bases which may mimic pneumothorax.  Evidence of   calcified pleural plaques again noted.  Prior CABG.  Heart size   normal.   Flattening of hemidiaphragms likely signifies COPD.    IMPRESSION:   No pneumothorax identified.  No new acute finding.

## 2010-02-04 NOTE — Letter (Signed)
Summary: [facility]   Imported By: Sherian Rein 05/27/2009 10:09:32  _____________________________________________________________________  External Attachment:    Type:   Image     Comment:   External Document
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
## 2010-02-04 NOTE — Miscellaneous (Signed)
  Clinical Lists Changes  Problems: Added new problem of * PRESYNCOPE Observations: Added new observation of PAST MED HX: ALLERGIC RHINITIS (ICD-477.9) HISTORY OF ASBESTOS EXPOSURE (ICD-V15.84) CAD (ICD-414.00).Marland KitchenMarland KitchenMyoview scan December 2 007.. question mild ischemia at the base of the anterolateral wall CABG 1985 - Echo 2004 EF 55-65%....  Right bundle branch block - Bradycardia- mild, tolerates low-dose beta blocker. CEREBROVASCULAR DISEASE (ICD-437.9 - Carotid artery disease.Marland KitchenMarland KitchenDoppler 2007.[name] 40-59% RICA-0-39% LICA GERD /HX OF ESOPHAGEAL STRICTURE2004 BENIGN PROSTATIC HYPERTROPHY, HX OF (ICD-V13.8) OSTEOARTHRITIS (ICD-715.90) OSTEOPOROSIS (ICD-733.00) LOW BACK PAIN SYNDROME (ICD-724.2) ANXIETY (ICD-300.00) Presyncope.....[facility]... May 26, 2009... probably vasovagal  (05/31/2009 16:57) Added new observation of PRIMARY MD: Alroy Dust, MD (05/31/2009 16:57)       Past History:  Past Medical History: ALLERGIC RHINITIS (ICD-477.9) HISTORY OF ASBESTOS EXPOSURE (ICD-V15.84) CAD (ICD-414.00).Marland KitchenMarland KitchenMyoview scan December 2 007.. question mild ischemia at the base of the anterolateral wall CABG 1985 - Echo 2004 EF 55-65%....  Right bundle branch block - Bradycardia- mild, tolerates low-dose beta blocker. CEREBROVASCULAR DISEASE (ICD-437.9 - Carotid artery disease.Marland KitchenMarland KitchenDoppler 2007.[name] 40-59% RICA-0-39% LICA GERD /HX OF ESOPHAGEAL STRICTURE2004 BENIGN PROSTATIC HYPERTROPHY, HX OF (ICD-V13.8) OSTEOARTHRITIS (ICD-715.90) OSTEOPOROSIS (ICD-733.00) LOW BACK PAIN SYNDROME (ICD-724.2) ANXIETY (ICD-300.00) Presyncope.....[facility]... May 26, 2009... probably vasovagal

## 2010-02-04 NOTE — Progress Notes (Signed)
Summary: leg  Phone Note Call from Patient Call back at Home Phone [phone]   Caller: Patient Call For: tammy parrett Summary of Call: pt wanted to let tp know that he is going to call an ortho dr re: leg. says tx isn't working.  Initial call taken by: Tivis Ringer, CNA,  June 24, 2009 10:46 AM  Follow-up for Phone Call        [facility] Zackery Barefoot [facility]  June 24, 2009 11:25 AM   FYI---pt has appt with dr Eulah Pont 6/21--will call us if he needs [place] Follow-up by: Philipp Deputy CMA,  June 24, 2009 4:17 PM

## 2010-02-04 NOTE — Assessment & Plan Note (Signed)
Summary: Acute NP office visit - left thigh pain   Copy to:  n/a Primary Provider/Referring Provider:  Alroy Dust, MD  CC:  c/o sharp pain in left thigh area when he bends his leg x1week - denies redness, knots, and bruising.  History of Present Illness: 61  with known history of CAD, Hyperlipidemia      ~  April 29, 2009:  stable without new complaints or concerns... he has new hearing aides which work well... he saw [name] 3/11> doing satis w/o CP angina etc; he is still taking the Imdur30- 1/2 Bid... we reviewed his pre-visit blood work & all looks good;  he declines f/u CXR today...  June 18, 2009--Presents for an acute office visit. Complains of c/o sharp pain in left thigh area when he bends his leg x1week . Takes tylenol w/ only minimal help. No redness, edema, rash. No known injury. Has had similar pain in hips and legs previously but did not last as long. Denies chest pain, dyspnea, orthopnea, hemoptysis, fever, n/v/d, edema, headache,recent travel or antibiotics.  Recent vagal episode w/ admission to hospital by [facility]-Dr. Myrtis Ser. No significant findings. probably vasovagal...discomfort from constipation.    Medications Prior to Update: 1)  Clarinex 5 Mg Tabs (Desloratadine) .... Take 1 Tablet By Mouth Once A Day 2)  Bayer Low Strength 81 Mg  Tbec (Aspirin) .... Take 1 Tablet By Mouth Once A Day 3)  Isosorbide Mononitrate Cr 30 Mg Tb24 (Isosorbide Mononitrate) .... Take 1/2 Tablets By Mouth Two Times A Day 4)  Metoprolol Succinate 25 Mg Xr24h-Tab (Metoprolol Succinate) .... Take 1 Tab By Mouth Once Daily.Marland KitchenMarland Kitchen 5)  Lipitor 20 Mg Tabs (Atorvastatin Calcium) .... Take 1/2 Tablets By Mouth Once Daily 6)  Fish Oil   Oil (Fish Oil) .... 2000mg  Once Daily 7)  Co Q-10 200 Mg  Caps (Coenzyme Q10) .... Take 1 Tabs By Mouth Once Daily 8)  Align  Caps (Probiotic Product) .... One Tablet By Mouth Once Daily 9)  Miralax  Powd (Polyethylene Glycol 3350) .[name].. 1 Capful in Water Daily 10)   Glucosamine-Chondroitin-Msm 500-250-250 Mg  Caps (Glucosamine-Chondroitin-Msm) .[name].. 1 Tablet Three Times A Day 11)  Multivitamins   Tabs (Multiple Vitamin) .... Take 1 Tablet By Mouth Once A Day 12)  Vitamin D3 2000 Unit Caps (Cholecalciferol) .... Once Daily 13)  Ca Mag & Phosp .... Take 1 Tablet By Mouth 4 Times Per Day. 14)  Colace 100 Mg Caps (Docusate Sodium) .... As Directed  Current Medications (verified): 1)  Clarinex 5 Mg Tabs (Desloratadine) .... Take 1 Tablet By Mouth Once A Day 2)  Bayer Low Strength 81 Mg  Tbec (Aspirin) .... Take 1 Tablet By Mouth Once A Day 3)  Isosorbide Mononitrate Cr 30 Mg Tb24 (Isosorbide Mononitrate) .... Take 1/2 Tablets By Mouth Two Times A Day 4)  Metoprolol Succinate 25 Mg Xr24h-Tab (Metoprolol Succinate) .... Take 1 Tab By Mouth Once Daily.Marland KitchenMarland Kitchen 5)  Lipitor 20 Mg Tabs (Atorvastatin Calcium) .... Take 1/2 Tablets By Mouth Once Daily 6)  Fish Oil 500 Mg Caps (Omega-3 Fatty Acids) .... Take 1 Capsule By Mouth Once A Day 7)  Co Q-10 200 Mg  Caps (Coenzyme Q10) .... Take 1 Tabs By Mouth Once Daily 8)  Align  Caps (Probiotic Product) .... One Tablet By Mouth Once Daily 9)  Miralax  Powd (Polyethylene Glycol 3350) .[name].. 1 Capful in Water Daily 10)  Glucosamine-Chondroitin-Msm 500-250-250 Mg  Caps (Glucosamine-Chondroitin-Msm) .[name].. 1 Tablet Three Times A Day 11)  Multivitamins  Tabs (Multiple Vitamin) .... Take 1 Tablet By Mouth Once A Day 12)  Vitamin D3 2000 Unit Caps (Cholecalciferol) .... Once Daily 13)  Ca Mag & Phosp .... Take 1 Tablet By Mouth 4 Times Per Day. 14)  Colace 100 Mg Caps (Docusate Sodium) .... As Directed 15)  Furosemide 20 Mg Tabs (Furosemide) .... Take 1 Tablet By Mouth Once A Day 16)  Melatonin 3 Mg Tabs (Melatonin) .... Take 1 Tablet By Mouth Once A Day  Allergies (verified): No Known Drug Allergies  Past History:  Past Medical History: Last updated: 06/04/2009 ALLERGIC RHINITIS (ICD-477.9) HISTORY OF ASBESTOS EXPOSURE  (ICD-V15.84) CAD (ICD-414.00).Marland KitchenMarland KitchenMyoview scan December 2 007.. question mild ischemia at the base of the anterolateral wall CABG 1985 - Echo 2004 EF 55-65%....  Right bundle branch block - Bradycardia- mild, tolerates low-dose beta blocker. CEREBROVASCULAR DISEASE (ICD-437.9 - Carotid artery disease.Marland KitchenMarland KitchenDoppler 2007.[name] 40-59% RICA-0-39% LICA GERD /HX OF ESOPHAGEAL STRICTURE2004 BENIGN PROSTATIC HYPERTROPHY, HX OF (ICD-V13.8) OSTEOARTHRITIS (ICD-715.90) OSTEOPOROSIS (ICD-733.00) LOW BACK PAIN SYNDROME (ICD-724.2) ANXIETY (ICD-300.00) Presyncope.....[facility]... May 26, 2009... probably vasovagal...discomfort from constipation  Past Surgical History: Last updated: 04/29/2009 S/P CABG [facility] in 1985 S/P TURP in 1987 & 1996 S/P RIH repair 2001 S/P hemorroid surgery S/P left THR 4/04  Family History: Last updated: 08/10/2008 Mother deceased at age 75 due to pneumonia No FH of Colon Cancer:  Social History: Last updated: Aug 10, 2008 Occupation: Retired,lives alone  Patient has never smoked.  Alcohol Use - no Illicit Drug Use - no uses decaff  Risk Factors: Smoking Status: never (10/12/2007)  Past Pulmonary History:  Pulmonary History: ALLERGIC RHINITIS (ICD-477.9) - he uses CLARINEX 5mg /d...    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 his prev CXR's show calcif pleural placques, prev CABG, otherw negative... yearly f/u film 4/10= no change... he declined f/u film 4/11 OV.  CAD (ICD-414.00) - on IMDUR 30mg - 1/2 Bid, METOPROLOL XL 25mg /d... he is s/p CABG at [facility] in [facility]... baseline EKG w/ old RBBB... [name] follows him regularly & prev noted reviewed...  ~  last NuclearStressTest was 12/07 w/ ?mild anterolat ischemia, 2nd degree HB w/ infusion, EF=57% (no change from 2005)...  ~  note: LE dopplers 10/09 were WNL w/ norm ABI's...  CEREBROVASCULAR DISEASE (ICD-437.9) - he  takes ASA 81mg  daily... CDopplers 2/07 showed mod irreg plaque, 40-59% RICAstenosis, and 0-39% LICAstenosis... f/u study 2/08 looked a bit better... overdue for f/u CDopplers & [name] is aware.  HYPERLIPIDEMIA (ICD-272.4) - on LIPITOR 20mg - 1/2 tab daily + FISH OIL 1000/d...  ~  FLP 9/09 showed TChol 126, TG 49, HDL 47, LDL 70  ~  FLP 4/10 on Lip10+FishOil showed TChol 170, TG 46, HDL 54, LDL 107  ~  FLP 4/11 on Lip10+FishOil showed TChol 137, TG 49, HDL 53, LDL 74... continue same Rx.  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  ~  he saw [name] Feb10 w/ dysphagia ?food impaction- stricture vs motility prob- rec> EGD, but pt declined.  DIVERTICULOSIS OF COLON (ICD-562.10) & COLONIC POLYPS (ICD-211.3)  ~  last colonoscopy 8/00 by [name] showed neg x mild divertics (similar to 1995 colonoscopy)  ~  10/10: [name] plans f/u colonoscopy soon> pt cancelled.  Hx of GROIN PAIN (ICD-789.09) - he had RIH repair w/ mesh by [name] in 2001.Marland KitchenMarland Kitchen  Review of Systems      See HPI  Vital Signs:  Patient profile:   77 year old male Height:      64 inches Weight:      133 pounds BMI:     22.91 O2 Sat:      95 % on Room air Temp:     96.9 degrees F oral Pulse rate:   64 / minute BP sitting:   116 / 74  (left arm) Cuff size:   regular  Vitals Entered By: Boone Master CNA/MA (June 18, 2009 10:59 AM)  O2 Flow:  Room air CC: c/o sharp pain in left thigh area when he bends his leg x1week - denies redness, knots, bruising Is Patient Diabetic? No Comments Medications reviewed with patient Daytime contact number verified with patient. Boone Master CNA/MA  June 18, 2009 11:00 AM    Physical Exam  Additional Exam:  WD, WN, 77  y/o WM in NAD... GENERAL:  Alert & oriented; pleasant & cooperative... HEENT:  [place]/AT,  EACs-clear, TMs-wnl, NOSE-clear discharge, pale mucosa   THROAT-clear & wnl. NECK:  Supple w/ fairROM; no JVD; normal carotid impulses w/o bruits; no thyromegaly or nodules palpated; no lymphadenopathy. CHEST:  Clear to P & A; without wheezes/ rales/ or rhonchi. HEART:  Regular Rhythm; gr 1/6 SEM w/o rubs or gallops. ABDOMEN:  Soft & nontender; normal bowel sounds; no organomegaly or masses detected. EXT: without deformities, mod arthritic changes; no varicose veins/ venous insuffic/ tr edema. along mid /lateral thigh he points to pain, nontender to touch, decreased ROM of hip but non-reproducible pain w/ external/internal rotation. NML gait.      Impression & Recommendations:  Problem # 1:  OSTEOARTHRITIS (ICD-715.90)  Suspect flare DJD of hip and leg  REC:  Begin Advil (Ibuprofen)  200mg  2 tabs two times a day for 7 days w/ food.  Warm heat to hip and thigh two times a day for 15 min each.  Tramadol 50mg  1 every 8 hr as needed pain, may make you sleepy.  Please contact office for sooner follow up if symptoms do not improve or worsen  His updated medication list for this problem includes:    Bayer Low Strength 81 Mg Tbec (Aspirin) .[name]... Take 1 tablet by mouth once a day    Tramadol Hcl 50 Mg Tabs (Tramadol hcl) .[name]... 1 by mouth every 8 hr as needed pain, may make you sleepy.  Orders: Est. Patient Level III (16109)  Medications Added to Medication List This Visit: 1)  Fish Oil 500 Mg Caps (Omega-3 fatty acids) .... Take 1 capsule by mouth once a day 2)  Furosemide 20 Mg Tabs (Furosemide) .... Take 1 tablet by mouth once a day 3)  Melatonin 3 Mg Tabs (Melatonin) .... Take 1 tablet by mouth once a day 4)  Tramadol Hcl 50 Mg Tabs (Tramadol hcl) .[name].. 1 by mouth every 8 hr as needed pain, may make you sleepy.  Complete Medication List: 1)  Clarinex 5 Mg Tabs (Desloratadine) .... Take 1 tablet by mouth once a day 2)  Bayer Low Strength 81 Mg Tbec (Aspirin) .... Take 1 tablet by mouth once a day 3)  Isosorbide Mononitrate Cr 30 Mg Tb24  (Isosorbide mononitrate) .... Take 1/2 tablets by mouth two times a day 4)  Metoprolol Succinate 25 Mg Xr24h-tab (Metoprolol succinate) .... Take 1 tab by mouth once daily.Marland KitchenMarland Kitchen 5)  Lipitor 20 Mg Tabs (Atorvastatin calcium) .... Take 1/2 tablets by mouth once daily 6)  Fish Oil 500 Mg Caps (Omega-3 fatty acids) .... Take 1 capsule by mouth once a day 7)  Co Q-10 200 Mg Caps (Coenzyme q10) .... Take 1 tabs by mouth once daily 8)  Align Caps (Probiotic product) .... One tablet by mouth once daily 9)  Miralax Powd (Polyethylene glycol 3350) .[name].. 1 capful in water daily 10)  Glucosamine-chondroitin-msm 500-250-250 Mg Caps (Glucosamine-chondroitin-msm) .[name].. 1 tablet three times a day 11)  Multivitamins Tabs (Multiple vitamin) .... Take 1 tablet by mouth once a day 12)  Vitamin D3 2000 Unit Caps (Cholecalciferol) .... Once daily 13)  Ca Mag & Phosp  .... Take 1 tablet by mouth 4 times per day. 14)  Colace 100 Mg Caps (Docusate sodium) .... As directed 15)  Furosemide 20 Mg Tabs (Furosemide) .... Take 1 tablet by mouth once a day 16)  Melatonin 3 Mg Tabs (Melatonin) .... Take 1 tablet by mouth once a day 17)  Tramadol Hcl 50 Mg Tabs (Tramadol hcl) .[name].. 1 by mouth every 8 hr as needed pain, may make you sleepy.  Patient Instructions: 1)  Begin Advil (Ibuprofen)  200mg  2 tabs two times a day for 7 days w/ food.  2)  Warm heat to hip and thigh two times a day for 15 min each.  3)  Tramadol 50mg  1 every 8 hr as needed pain, may make you sleepy.  4)  Please contact office for sooner follow up if symptoms do not improve or worsen  Prescriptions: TRAMADOL HCL 50 MG TABS (TRAMADOL HCL) 1 by mouth every 8 hr as needed pain, may make you sleepy.  #20 x 0   Entered and Authorized by:   Rubye Oaks NP   Signed by:   Rubye Oaks NP on 06/18/2009   Method used:   Electronically to        [facility]* (retail)       [address] [place], [place]   [address]       Ph: [phone]       Fax: [phone]   RxID:   [id number]

## 2010-02-04 NOTE — Letter (Signed)
Summary: [facility]   Imported By: Lennie Odor 10/24/2009 11:54:50  _____________________________________________________________________  External Attachment:    Type:   Image     Comment:   External Document

## 2010-02-04 NOTE — Progress Notes (Signed)
Summary: PT NEEDS A HANDI CAP STICKER   Phone Note Call from Patient   Caller: Patient Summary of Call: PT CALLING TO GET A HANDICAP STICKER Initial call taken by: Judie Grieve,  January 07, 2009 10:20 AM  Follow-up for Phone Call        will mail to pt, he is aware Meredith Staggers, RN  January 07, 2009 4:02 PM

## 2010-02-04 NOTE — Progress Notes (Signed)
Summary: possible UTI----appt with TP 10/22/2009  Phone Note Call from Patient Call back at [facility] Phone [phone]   Caller: Patient Call For: nadel Summary of Call: pt c/o dark colored/ burning urine x 1 wk. wants to do labs tomorrow. no fever. no N or V. Initial call taken by: Tivis Ringer, CNA,  October 21, 2009 3:44 PM  Follow-up for Phone Call        called and spoke with pt.  pt believes he has a UTI.  Pt c/o burning while urinating and urine is light brown in color.  Pt states he does have frequency and urgency but states this isn't "a new problem."  Pt denied a fever.  Pt agreed to come in and be seen.  Pt scheduled to see TP tomorrow 10/22/2009 at 11:45am.  Arman Filter LPN  October 21, 2009 4:04 PM

## 2010-02-04 NOTE — Progress Notes (Signed)
Summary: pt has question regading meds  ASK JK   Phone Note Call from Patient Call back at Home Phone [phone]   Caller: Patient Reason for Call: Talk to Nurse, Talk to Doctor Summary of Call: pt has questions regarding meds that are on discharge med list  Initial call taken by: Omer Jack,  June 14, 2009 9:00 AM  Follow-up for Phone Call        PT STATES HE HAD BEEN ON LASIX PRIOR TO LAST HOSPITAL.  LASIX (20 MG)WAS TO BE HELD UNTIL HE WAS SEEN BACK IN THE OFFICE BUT IT WASN'T ADDRESSED.  PT STATES HE IS HAVING SOME SIGHT SWELLING IN THE ANKLE AREA AND WANTS TO KNOW IF HE SHOULD RESUME MEDICATION.  WILL DISCUSS WITH DR Myrtis Ser AND FOLLOW UP WITH PT.  PT STATES UNDERSTANDING Follow-up by: Charolotte Capuchin, RN,  June 14, 2009 9:34 AM  Additional Follow-up for Phone Call Additional follow up Details #1::        Restart Lasix per Dr Jerral Bonito  Called patient and left message on machine OK for pt to restart Lasix as previous and call back if any questions/concerns Additional Follow-up by: Charolotte Capuchin, RN,  June 14, 2009 11:06 AM

## 2010-02-04 NOTE — Miscellaneous (Signed)
Summary: Order & Plan of Care / [facility]  Order & Plan of Care / [facility]   Imported By: Lennie Odor 10/03/2009 13:55:38  _____________________________________________________________________  External Attachment:    Type:   Image     Comment:   External Document

## 2010-02-04 NOTE — Miscellaneous (Signed)
Summary: Treatment & Care Plan orders/[facility]  Treatment & Care Plan orders/[facility]   Imported By: Sherian Rein 11/01/2009 08:32:26  _____________________________________________________________________  External Attachment:    Type:   Image     Comment:   External Document

## 2010-02-04 NOTE — Letter (Signed)
Summary: [facility]  [facility]   Imported By: Lester [place] 07/18/2009 08:54:17  _____________________________________________________________________  External Attachment:    Type:   Image     Comment:   External Document

## 2010-02-04 NOTE — Assessment & Plan Note (Signed)
Summary: rov/apc   Primary Care Provider:  Alroy Dust, MD  CC:  6 month ROv & review of mult medical problems....  History of Present Illness: [name] here for a follow up visit...  he has multiple medical problems as noted below...     ~  [name]:  I last saw [name] 4/07, but he is followed regularly by [name] & [name] for GI... for 77 y/o he is really doing quite well... he lives at [facility] and exercises in their GYM regularly- walking, exerc bike, etc... he notes SOB and chest tightness if he goes "too fast" otherw he does very well...   He notes a mild persistant problem w/ his swallowing... intermittently describing it as a "choking" or discomfort that may lead to a cough or some eye watering esp after eating strong flavors as in salad dressings... he's even mentioned this to [name], who has reassured him that his eyes are OK.    ~  Apr10:  [name] has several complaints today- 1) nose runs whenever he eats and he is quite disturbed by this (we discussed adding Astepro to the Clarinex).Marland KitchenMarland Kitchen  2) weight loss- his weight today is 132# which is 3# lighter than last yr (we discussed nutritional supplements daily for weight gain).Marland KitchenMarland Kitchen  3) persistant leg discomfort- this has been an on-going complaint x yrs... he saw [name] in 2009 and she rec ROBAXIN 1/2 to 1 tab Tid but he never tried it, states that Tylenol 500mg  works OK.  ~  Oct10:  had recent URI & ZPak helped... c/o legs stiff, "cold" & painful in the AM; but not waking him up at night... it improves w/ moving around in the AM... offered Neurontin trial but he doesn't want more pills... he contiues to exercise 3x per wk.   ~  April 29, 2009:  stable without new complaints or concerns... he has new hearing aides which work well... he saw [name] 3/11> doing satis w/o CP angina etc; he is still taking the Imdur30- 1/2 Bid... we reviewed his pre-visit blood work & all looks good;  he declines f/u CXR today...    Current  Problems:   ALLERGIC RHINITIS (ICD-477.9) - he uses CLARINEX 5mg /d... he complains of nose running when he eats and he feels this is quite a dilemma- we discussed trial of ASTEPRO 0.15%- 2spBid> it didn't help.  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 his prev CXR's show calcif pleural placques, prev CABG, otherw negative... yearly f/u film 4/10= no change... he declined f/u film 4/11 OV.  CAD (ICD-414.00) - on IMDUR 30mg - 1/2 Bid, METOPROLOL XL 25mg /d... he is s/p CABG at [facility] in 1985... baseline EKG w/ old RBBB... [name] follows him regularly & prev noted reviewed...  ~  last NuclearStressTest was 12/07 w/ ?mild anterolat ischemia, 2nd degree HB w/ infusion, EF=57% (no change from 2005)...  ~  note: LE dopplers 10/09 were WNL w/ norm ABI's...  CEREBROVASCULAR DISEASE (ICD-437.9) - he takes ASA 81mg  daily... CDopplers 2/07 showed mod irreg plaque, 40-59% RICAstenosis, and 0-39% LICAstenosis... f/u study 2/08 looked a bit better... overdue for f/u CDopplers & [name] is aware.  HYPERLIPIDEMIA (ICD-272.4) - on LIPITOR 20mg - 1/2 tab daily + FISH OIL 1000/d...  ~  FLP 9/09 showed TChol 126, TG 49, HDL 47, LDL 70  ~  FLP 4/10 on Lip10+FishOil showed TChol 170, TG 46,  HDL 54, LDL 107  ~  FLP 4/11 on Lip10+FishOil showed TChol 137, TG 49, HDL 53, LDL 74... continue same Rx.  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  ~  he saw [name] Feb10 w/ dysphagia ?food impaction- stricture vs motility prob- rec> EGD, but pt declined.  DIVERTICULOSIS OF COLON (ICD-562.10) & COLONIC POLYPS (ICD-211.3)  ~  last colonoscopy 8/00 by [name] showed neg x mild divertics (similar to 1995 colonoscopy)  ~  10/10: [name] plans f/u colonoscopy soon> pt cancelled.  Hx of  GROIN PAIN (ICD-789.09) - he had RIH repair w/ mesh by [name] in 2001... then had emergent repair of a hernia by [name] in 2009 w/ post-op hematoma... all resolved now.  BENIGN PROSTATIC HYPERTROPHY, HX OF (ICD-V13.8) & PSA, INCREASED (ICD-790.93) - followed by [name] and last seen 9/09...   ~  labs 4/10 showed PSA= 2.52  OSTEOARTHRITIS (ICD-715.90) - he was eval 5/08 by Rober Minion and has seen [name]... he has DJD, Osteopenia & s/p left THR... he was rec to take Actonel 35mg /wk but he declined bisphos Rx "I have too many pills"... he does take Glucosamine, MVI, Vit D 2000 u daily.  LOW BACK PAIN SYNDROME (ICD-724.2)  ANXIETY (ICD-300.00)   Allergies (verified): No Known Drug Allergies  Comments:  Nurse/Medical Assistant: The patient's medications and allergies were reviewed with the patient and were updated in the Medication and Allergy Lists.  Past History:  Past Medical History: ALLERGIC RHINITIS (ICD-477.9) HISTORY OF ASBESTOS EXPOSURE (ICD-V15.84) CAD (ICD-414.00).Marland KitchenMarland KitchenMyoview scan December 2 007.. question mild ischemia at the base of the anterolateral wall CABG 1985 - Echo 2004 EF 55-65%....  Right bundle branch block - Bradycardia- mild, tolerates low-dose beta blocker. CEREBROVASCULAR DISEASE (ICD-437.9 - Carotid artery disease.Marland KitchenMarland KitchenDoppler 2007.[name] 40-59% RICA-0-39% LICA GERD /HX OF ESOPHAGEAL STRICTURE2004 BENIGN PROSTATIC HYPERTROPHY, HX OF (ICD-V13.8) OSTEOARTHRITIS (ICD-715.90) OSTEOPOROSIS (ICD-733.00) LOW BACK PAIN SYNDROME (ICD-724.2) ANXIETY (ICD-300.00)  Past Surgical History: S/P CABG at [facility] in 1985 S/P TURP in 1987 & 1996 S/P RIH repair 2001 S/P hemorroid surgery S/P left THR 4/04  Family History: Reviewed history from 07/25/2008 and no changes required. Mother deceased at age 96 due to pneumonia No FH of Colon Cancer:  Social History: Reviewed history from 07/25/2008 and no changes required. Occupation: Retired,lives alone  Patient has  never smoked.  Alcohol Use - no Illicit Drug Use - no uses decaff  Review of Systems      See HPI       The patient complains of decreased hearing, dyspnea on exertion, muscle weakness, and difficulty walking.  The patient denies anorexia, fever, weight loss, weight gain, vision loss, hoarseness, chest pain, syncope, peripheral edema, prolonged cough, headaches, hemoptysis, abdominal pain, melena, hematochezia, severe indigestion/heartburn, hematuria, incontinence, suspicious skin lesions, transient blindness, depression, unusual weight change, abnormal bleeding, enlarged lymph nodes, and angioedema.    Vital Signs:  Patient profile:   76 year old male Height:      64 inches Weight:      133.38 pounds O2 Sat:      93 % on Room air Temp:     96.8 degrees F oral Pulse rate:   55 / minute BP sitting:   120 / 64  (right arm) Cuff size:   regular  Vitals Entered By: Randell Loop CMA (April 29, 2009 2:34 PM)  O2 Sat at Rest %:  93 O2 Flow:  Room air  CC: 6 month ROv & review of mult medical problems... Is Patient Diabetic? No Pain Assessment Patient in pain? no      Comments meds updated today   Physical Exam  Additional Exam:  WD, WN, [name] in NAD... GENERAL:  Alert & oriented; pleasant & cooperative... HEENT:  [place]/AT, EOM-full, Glasses, EACs-clear, TMs-wnl, NOSE-clear discharge; THROAT-clear & wnl. NECK:  Supple w/ fairROM; no JVD; normal carotid impulses w/o bruits; no thyromegaly or nodules palpated; no lymphadenopathy. CHEST:  Clear to P & A; without wheezes/ rales/ or rhonchi. HEART:  Regular Rhythm; gr 1/6 SEM w/o rubs or gallops. ABDOMEN:  Soft & nontender; normal bowel sounds; no organomegaly or masses detected. EXT: without deformities, mod arthritic changes; no varicose veins/ venous insuffic/ tr edema. NEURO: no focal neuro deficits... DERM: no lesions noted...    MISC. Report  Procedure date:  04/22/2009  Findings:      BMP (METABOL)   Sodium                     139 mEq/L                   135-145   Potassium                 4.8 mEq/L                   3.5-5.1   Chloride                  104 mEq/L                   96-112   Carbon Dioxide            31 mEq/L                    19-32   Glucose                   78 mg/dL                    42-70   BUN                       18 mg/dL                    6-23   Creatinine                1.1 mg/dL                   7.6-2.8   Calcium                   9.1 mg/dL                   3.1-51.7   GFR                       66.68 mL/min                >60  Lipid Panel (LIPID)   Cholesterol               137 mg/dL                   6-160   Triglycerides             49.0 mg/dL  0.0-149.0   HDL                       16.10 mg/dL                 >96.04   LDL Cholesterol           74 mg/dL                    5-40  CBC Platelet w/Diff (CBCD)   White Cell Count          7.3 K/uL                    4.5-10.5   Red Cell Count            4.45 Mil/uL                 4.22-5.81   Hemoglobin                15.1 g/dL                   98.1-19.1   Hematocrit                43.3 %                      39.0-52.0   MCV                       97.4 fl                     78.0-100.0   Platelet Count            159.0 K/uL                  150.0-400.0   Neutrophil %              56.0 %                      43.0-77.0   Lymphocyte %              27.8 %                      12.0-46.0   Monocyte %           [H]  12.6 %                      3.0-12.0   Eosinophils%              3.3 %                       0.0-5.0   Basophils %               0.3 %   Comments:      Hepatic/Liver Function Panel (HEPATIC)   Total Bilirubin           0.7 mg/dL                   4.7-8.2   Direct Bilirubin          0.1 mg/dL                   9.5-6.2   Alkaline Phosphatase      47 U/L  39-117   AST                       26 U/L                      0-37   ALT                       28 U/L                      0-53    Total Protein             7.0 g/dL                    0.9-6.0   Albumin                   4.0 g/dL                    4.5-4.0  Tests: (5) TSH (TSH)   FastTSH                   1.68 uIU/mL                 0.35-5.50  Tests: (6) Prostate Specific Antigen (PSA)   PSA-Hyb                   3.17 ng/mL                  0.10-4.00   Impression & Recommendations:  Problem # 1:  HISTORY OF ASBESTOS EXPOSURE (ICD-V15.84) Due for yearly f/u CXR but he declines... discussed w/ pt needs yearly f/u film to check pleural plaques & early detection of any change...  Problem # 2:  CORONARY ARTERY DISEASE (ICD-414.00) Followed by [name] for Cards-  stable... he tried to decr his IMDUR & stop but pt prefers to take 1/2 tab  two times a day as before... His updated medication list for this problem includes:    Bayer Low Strength 81 Mg Tbec (Aspirin) .[name]... Take 1 tablet by mouth once a day    Isosorbide Mononitrate Cr 30 Mg Tb24 (Isosorbide mononitrate) .[name]... Take 1/2 tablets by mouth two times a day    Metoprolol Succinate 25 Mg Xr24h-tab (Metoprolol succinate) .[name]... Take 1 tab by mouth once daily...    Lasix 20 Mg Tabs (Furosemide) .[name]... Take 1 tablet by mouth once a day  Problem # 3:  CAROTID ARTERY DISEASE (ICD-433.10) He continues on ASA daily, asymptomatic... [name] is aware & will sched f/u CDopplers when needed... His updated medication list for this problem includes:    Bayer Low Strength 81 Mg Tbec (Aspirin) .[name]... Take 1 tablet by mouth once a day  Problem # 4:  HYPERLIPIDEMIA (ICD-272.4) Doing satis on the Lip10mg /d... His updated medication list for this problem includes:    Lipitor 20 Mg Tabs (Atorvastatin calcium) .[name]... Take 1/2 tablets by mouth once daily  Problem # 5:  DYSPHAGIA, PHARYNGOESOPHAGEAL PHASE (JWJ-191.47) GI per [name]...  Problem # 6:  BENIGN PROSTATIC HYPERTROPHY, HX OF (ICD-V13.8) GU per [name]...  Problem # 7:  OSTEOARTHRITIS (ICD-715.90) Stable w/ OTC  meds... His updated medication list for this problem includes:    Bayer Low Strength 81 Mg Tbec (Aspirin) .[name]... Take 1 tablet by mouth once a day  Problem # 8:  OTHER MEDICAL PROBLEMS AS NOTED>>>  Complete Medication List: 1)  Clarinex 5 Mg Tabs (Desloratadine) .... Take 1 tablet by mouth once a day 2)  Bayer Low Strength 81 Mg Tbec (Aspirin) .... Take 1 tablet by mouth once a day 3)  Isosorbide Mononitrate Cr 30 Mg Tb24 (Isosorbide mononitrate) .... Take 1/2 tablets by mouth two times a day 4)  Metoprolol Succinate 25 Mg Xr24h-tab (Metoprolol succinate) .... Take 1 tab by mouth once daily.Marland KitchenMarland Kitchen 5)  Lasix 20 Mg Tabs (Furosemide) .... Take 1 tablet by mouth once a day 6)  Lipitor 20 Mg Tabs (Atorvastatin calcium) .... Take 1/2 tablets by mouth once daily 7)  Fish Oil Oil (Fish oil) .... 2000mg  once daily 8)  Co Q-10 200 Mg Caps (Coenzyme q10) .... Take 1 tabs by mouth once daily 9)  Align Caps (Probiotic product) .... One tablet by mouth once daily 10)  Miralax Powd (Polyethylene glycol 3350) .[name].. 1 capful in water daily 11)  Glucosamine-chondroitin-msm 500-250-250 Mg Caps (Glucosamine-chondroitin-msm) .[name].. 1 tablet three times a day 12)  Multivitamins Tabs (Multiple vitamin) .... Take 1 tablet by mouth once a day 13)  Vitamin D3 2000 Unit Caps (Cholecalciferol) .... Once daily 14)  Ca Mag & Phosp  .... Take 1 tablet by mouth 4 times per day.  Patient Instructions: 1)  Today we updated your med list- see below.... 2)  Continue your current medications the same... 3)  Stay as active as possible & be careful... 4)  Call for any problems.Marland KitchenMarland Kitchen  5)  Please schedule a follow-up appointment in 6 months.

## 2010-02-04 NOTE — Progress Notes (Signed)
Summary: requesting orders  Phone Note Call from Patient Call back at Home Phone [phone]   Caller: Patient Call For: nadel Summary of Call: pt wants to see dr Kriste Basque re: "general deterioration of his health". pt was seen recently but wants to come in again.   Pt's significant other Billey Gosling would like to talk to nurse in ref to pt's condition, she can be reached at [phone].Darletta Moll  September 19, 2009 1:26 PM  Initial call taken by: Tivis Ringer, CNA,  September 19, 2009 12:49 PM  Follow-up for Phone Call        Called, spoke with pt.  He states since last seeing SN on 9.1.11, he is having increased trouble walking, generalized weakness that has increased, and using can more often.  D/t this, he is requesting SN order PT for him through [facility].  Advised pt [name] has called and wanted to speak with [place] regarding his condition as well -- he is ok with me talking to her.  Called, spoke with Peggy.  She just wanted to clarrify above message was given right by pt and also would like to ask Dr. Claud Kelp one extra question -- States pt's feet are really swollen and would like to know if they were like this at OV 2 wks ago.  Dr. Kriste Basque, pls advise if ok for PT order and if pt's legs were swollen at last OV.  Thanks!   Follow-up by: Gweneth Dimitri RN,  September 19, 2009 2:46 PM  Additional Follow-up for Phone Call Additional follow up Details #1::        per SN---ok for pt to have the PT   [facility].  only mild edema at last ov and he is on lasix 20mg  daily---SN recs are no salt and increase the lasix to 40mg  every am until the swelling resolves then decrease back to 20mg  daily thereafter. Randell Loop CMA  September 19, 2009 3:18 PM   New Problems: WEAKNESS (ICD-780.79)   Additional Follow-up for Phone Call Additional follow up Details #2::    Order placed to [facility] for PT.    Called, spoke with pt and Peggy.  They were both informed of above per SN.   They both verbalized understanding of instructions.   Follow-up by: Gweneth Dimitri RN,  September 19, 2009 3:36 PM  New Problems: WEAKNESS (ICD-780.79)

## 2010-02-04 NOTE — Assessment & Plan Note (Signed)
Summary: f81m  Medications Added CO Q-10 200 MG  CAPS (COENZYME Q10) take 1 tabs by mouth once daily      Allergies Added: NKDA  Visit Type:  Follow-up Referring Provider:  n/a Primary Provider:  Alroy Dust, MD  CC:  CAD.  History of Present Illness: The patient is seen for followup of coronary artery disease.  I saw him last September, 2010.  Fortunately his diarrhea is now improved after careful treatment.  He's taking a very small dose of nitrate.  He has difficulties cutting 30 mg dose.  He is not having any significant chest pain.  Current Medications (verified): 1)  Clarinex 5 Mg Tabs (Desloratadine) .... Take 1 Tablet By Mouth Once A Day 2)  Bayer Low Strength 81 Mg  Tbec (Aspirin) .... Take 1 Tablet By Mouth Once A Day 3)  Isosorbide Mononitrate Cr 30 Mg Tb24 (Isosorbide Mononitrate) .... Take 1/2 Tablets By Mouth Two Times A Day 4)  Metoprolol Succinate 25 Mg Xr24h-Tab (Metoprolol Succinate) .... Take 1 Tab By Mouth Once Daily.Marland KitchenMarland Kitchen 5)  Lasix 20 Mg  Tabs (Furosemide) .... Take 1 Tablet By Mouth Once A Day 6)  Lipitor 20 Mg Tabs (Atorvastatin Calcium) .... Take 1/2 Tablets By Mouth Once Daily 7)  Fish Oil   Oil (Fish Oil) .... 2000mg  Once Daily 8)  Co Q-10 200 Mg  Caps (Coenzyme Q10) .... Take 1 Tabs By Mouth Once Daily 9)  Glucosamine-Chondroitin-Msm 500-250-250 Mg  Caps (Glucosamine-Chondroitin-Msm) .[name].. 1 Tablet Three Times A Day 10)  Multivitamins   Tabs (Multiple Vitamin) .... Take 1 Tablet By Mouth Once A Day 11)  Vitamin D3 2000 Unit Caps (Cholecalciferol) .... Once Daily 12)  Ca Mag & Phosp .... Take 1 Tablet By Mouth 4 Times Per Day. 13)  Align  Caps (Probiotic Product) .... One Tablet By Mouth Once Daily  Allergies (verified): No Known Drug Allergies  Past History:  Past Medical History: ALLERGIC RHINITIS (ICD-477.9) HISTORY OF ASBESTOS EXPOSURE (ICD-V15.84) CAD (ICD-414.00).Marland KitchenMarland KitchenMyoview scan December 2 007.. question mild ischemia at the base of the anterolateral  wall CABG 1985 CEREBROVASCULAR DISEASE (ICD-437.9 GERD /HX OF ESOPHAGEAL STRICTURE2004 BENIGN PROSTATIC HYPERTROPHY, HX OF (ICD-V13.8) OSTEOARTHRITIS (ICD-715.90) OSTEOPOROSIS (ICD-733.00) LOW BACK PAIN SYNDROME (ICD-724.2) ANXIETY (ICD-300.00) Carotid artery disease.Marland KitchenMarland KitchenDoppler 2007.[name] 40-59% RICA-0-39% LICA Bradycardia.... mild.[name] tolerates low-dose beta blocker Right bundle branch block EF 55-65%.... echo.. 2004 Diarrhea.... finally improved... March, 2011  Review of Systems       Patient denies fever, chills, headache, sweats, rash, change in vision, change in hearing, chest pain, shortness of breath, cough, nausea or vomiting, urinary symptoms.  All other systems are reviewed and are negative.  Vital Signs:  Patient profile:   77 year old male Height:      64 inches Weight:      132 pounds Pulse rate:   60 / minute BP sitting:   136 / 60  (left arm) Cuff size:   regular  Vitals Entered By: Hardin Negus, RMA (March 28, 2009 11:46 AM)  Physical Exam  General:  patient is stable today. Eyes:  no xanthelasma. Neck:  no jugular venous distention. Lungs:  lungs are clear respiratory effort is nonlabored. Heart:  cardiac exam reveals S1-S2.  No clicks or significant murmurs. Abdomen:  abdomen is soft. Extremities:  no peripheral edema. Psych:  patient is oriented to person time and place.  Affect is normal.   Impression & Recommendations:  Problem # 1:  CORONARY ARTERY DISEASE (ICD-414.00)  His updated medication list for  this problem includes:    Bayer Low Strength 81 Mg Tbec (Aspirin) .[name]... Take 1 tablet by mouth once a day    Isosorbide Mononitrate Cr 30 Mg Tb24 (Isosorbide mononitrate) .[name]... Take 1/2 tablets by mouth two times a day    Metoprolol Succinate 25 Mg Xr24h-tab (Metoprolol succinate) .[name]... Take 1 tab by mouth once daily... Coronary disease is stable.We will check to be sure that his nitrate is an extended release form.  In the meantime because his doses so  small I have encouraged him to stop the nitrate.  If he has any symptoms he will return to taking a fall 30 mg dose.  If he has headache he will once again try to cut this in half.  Problem # 2:  DIARRHEA (ICD-787.91) Am very pleased to hear that his diarrhea has finally improved.  No further workup.  Problem # 3:  BRADYCARDIA (ICD-427.89)  His updated medication list for this problem includes:    Bayer Low Strength 81 Mg Tbec (Aspirin) .[name]... Take 1 tablet by mouth once a day    Isosorbide Mononitrate Cr 30 Mg Tb24 (Isosorbide mononitrate) .[name]... Take 1/2 tablets by mouth two times a day    Metoprolol Succinate 25 Mg Xr24h-tab (Metoprolol succinate) .[name]... Take 1 tab by mouth once daily... Heart rate is 60 today.  This is stable.  No further workup.  Patient Instructions: 1)  Stop Isosorbide, can restart at 1 tab daily if symptoms return 2)  Follow up in 6 months

## 2010-02-04 NOTE — Progress Notes (Signed)
Summary: labs  Phone Note Call from Patient Call back at Home Phone [phone]   Caller: Patient Call For: Ravin Denardo Summary of Call: Pt  has appt with SN next week, wants an order put in for his labs and wants a call back immediately, he is extremely irate. Initial call taken by: Darletta Moll,  April 19, 2009 3:05 PM  Follow-up for Phone Call        please advise what labs you would like the pt to have---appt scheduled for 4-25 at 2:30 Randell Loop CMA  April 19, 2009 3:07 PM   per SN order FLP-272.0, BMET-401.9, hepatice panel 790.5, cbc w/ diff-785.5, tsh-244.9, psa v76.44. labs entered pt aware.Carron Curie CMA  April 19, 2009 5:07 PM

## 2010-02-04 NOTE — Miscellaneous (Signed)
Summary: Order for Treatment & Plan of Care / [employer]  Order for Treatment & Plan of Care / [facility]   Imported By: Lennie Odor 12/11/2009 15:01:51  _____________________________________________________________________  External Attachment:    Type:   Image     Comment:   External Document

## 2010-02-04 NOTE — Progress Notes (Signed)
Summary: urin culture results  Phone Note Call from Patient Call back at Home Phone [phone]   Caller: Patient Call For: parrett Summary of Call: Returning Stites R's call. Initial call taken by: Darletta Moll,  October 25, 2009 3:14 PM  Follow-up for Phone Call        spoke with pt and advised of urine culture results and the need to stop cipro and start augmentin 875mg  two times a day for 10 days.  Pt states understanding and requests rx be sent to [address] Carron Curie CMA  October 25, 2009 4:03 PM     New/Updated Medications: AUGMENTIN 875-125 MG TABS (AMOXICILLIN-POT CLAVULANATE) Take 1 tablet by mouth two times a day Prescriptions: AUGMENTIN 875-125 MG TABS (AMOXICILLIN-POT CLAVULANATE) Take 1 tablet by mouth two times a day  #20 x 0   Entered by:   Carron Curie CMA   Authorized by:   Rubye Oaks NP   Signed by:   Carron Curie CMA on 10/25/2009   Method used:   Electronically to        [facility] (retail)       [address] [place], [place]  [address]       Ph: [phone]       Fax: [phone]   RxID:   [id number]

## 2010-02-05 ENCOUNTER — Telehealth: Payer: Self-pay | Admitting: Pulmonary Disease

## 2010-02-06 ENCOUNTER — Telehealth (INDEPENDENT_AMBULATORY_CARE_PROVIDER_SITE_OTHER): Payer: Self-pay | Admitting: *Deleted

## 2010-02-06 NOTE — Assessment & Plan Note (Signed)
Summary: NP,LEFT PAIN,MC   Vital Signs:  Patient profile:   77 year old male Height:      64 inches Weight:      126 pounds BMI:     21.71 Pulse rate:   56 / minute BP sitting:   132 / 77  Vitals Entered By: Rochele Pages RN (January 29, 2010 10:05 AM) CC: Leg    Referring Provider:  n/a Primary Provider:  Alroy Dust, MD  CC:  Leg .  History of Present Illness: 77 yo M with cardiac history and prior L hip replacement who presents with about 1 year of intermittent sharp pain in L upper leg and groin.  Reports 30 years of weakness in leg dx'd by neurologist as radicular.  No help from chiropractic therapy.    Returned to Dr. Greig Right office when this pain started last year.  X-rays were negative for hip joint pathology.  Describes pain with putting on pants, socks and shoes, getting out of bed and getting into and out of the car.  No pain in back or knees or with walking.  Uses cane with right hand for about 1 year.  sent by Dr Margaretha Seeds for my eval  Allergies: No Known Drug Allergies  Physical Exam  General:  Well-developed,well-nourished,in no acute distress; alert,appropriate and cooperative throughout examination Head:  Normocephalic and atraumatic without obvious abnormalities. Eyes:  PERRL Msk:  Left hip and leg: Pain with passive and active hip flexion and knee extension.  L knee extends only to -10 degrees causing effective leg length discrepancy L<R.  Hip internal rotation 20 degrees, external rotation 10 degrees vs R hip internal 10 degrees and external 20 degrees.  Hip flexion is limited bilat and more on LT dynamic hip flexion cause pain down ant thigh   Impression & Recommendations:  Problem # 1:  LEG PAIN, LEFT (ICD-729.5) this seems to be from femoral N pinch related to his hip pathology  radiates into ant leg  will try tramadol as I am afraid to use stronger meds with his med list as noted and with his age  reck in 1 mo  Problem # 2:  OSTEOARTHRITIS  (ICD-715.90)  His updated medication list for this problem includes:    Bayer Low Strength 81 Mg Tbec (Aspirin) .[name]... Take 1 tablet by mouth once a day    Tramadol Hcl 50 Mg Tabs (Tramadol hcl) .[name]... Take 1 by mouth in the morning and 1 by mouth at 4 pm  Has generalized joint issues but some specific probs with left hip for several years Dr Eulah Pont did THR but now pain is returning after several years suspect significant component of muscle contracture and scarring around hip and capsule  will work easy motion exercises only  Complete Medication List: 1)  Clarinex 5 Mg Tabs (Desloratadine) .... Take 1 tablet by mouth once a day 2)  Bayer Low Strength 81 Mg Tbec (Aspirin) .... Take 1 tablet by mouth once a day 3)  Isosorbide Mononitrate Cr 30 Mg Tb24 (Isosorbide mononitrate) .... Take 1 tablet  by mouth once daily 4)  Metoprolol Succinate 25 Mg Xr24h-tab (Metoprolol succinate) .... Take 1 tab by mouth once daily.Marland KitchenMarland Kitchen 5)  Furosemide 20 Mg Tabs (Furosemide) .... Take 1 tablet by mouth once a day 6)  Lipitor 20 Mg Tabs (Atorvastatin calcium) .... Take 1/2 tablets by mouth once daily 7)  Fish Oil 500 Mg Caps (Omega-3 fatty acids) .... Take 1 capsule by mouth once a day 8)  Co Q-10 200 Mg Caps (Coenzyme q10) .... Take 1 tabs by mouth once daily 9)  Align Caps (Probiotic product) .... One tablet by mouth once daily 10)  Miralax Powd (Polyethylene glycol 3350) .[name].. 1 capful in water daily 11)  Colace 100 Mg Caps (Docusate sodium) .... As directed 12)  Glucosamine-chondroitin-msm 500-250-250 Mg Caps (Glucosamine-chondroitin-msm) .[name].. 1 tablet three times a day 13)  Multivitamins Tabs (Multiple vitamin) .... Take 1 tablet by mouth once a day 14)  Vitamin D3 2000 Unit Caps (Cholecalciferol) .... Once daily 15)  Calcium-magnesium-zinc 333-133-5 Mg Tabs (Calcium-magnesium-zinc) .... Take one by mouth four times a day 16)  Tramadol Hcl 50 Mg Tabs (Tramadol hcl) .... Take 1 by mouth in the morning and 1 by  mouth at 4 pm  Patient Instructions: 1)  1) Take 50 mg Tramadol by mouth twice daily for pain: once in the morning, once at 4pm. 2)  2) Use felt pad in left shoe to adjust for effective leg length discrepancy. 3)  3) Exercises to strengthen the left hip abductor, adductor, and flexor muscles: 4)  1 set of 5 standing leg circles 5)  1 set of 5 standing straight leg hip flex and extend 6)  1 set of 5 standing hip abduction and adduction 7)  1 set of 5 seated knee raises to, but not past, the point of pain   Prescriptions: TRAMADOL HCL 50 MG TABS (TRAMADOL HCL) take 1 by mouth in the morning and 1 by mouth at 4 pm  #60 x 1   Entered by:   Rochele Pages RN   Authorized by:   Enid Baas MD   Signed by:   Rochele Pages RN on 01/29/2010   Method used:   Electronically to        [facility]* (retail)       [address] [place], [place]  [address]       Ph: [phone]       Fax: [phone]   RxID:   [id number]    Orders Added: 1)  New Patient Level III [40102]
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
## 2010-02-06 NOTE — Miscellaneous (Signed)
Summary: Order/[facility]  Order/[facility]   Imported By: Lester [place] 12/26/2009 11:00:31  _____________________________________________________________________  External Attachment:    Type:   Image     Comment:   External Document

## 2010-02-07 ENCOUNTER — Telehealth: Payer: Self-pay | Admitting: Pulmonary Disease

## 2010-02-11 ENCOUNTER — Encounter: Payer: Self-pay | Admitting: Pulmonary Disease

## 2010-02-12 NOTE — Progress Notes (Signed)
Summary: faxed orders yesterday  Phone Note From Other Clinic   Caller: debbie with [facility] Call For: [name] Summary of Call: Debbie with [facility] at [facility] phoned stated that they faxed orders for speech therapy yesterday and wants to make sure that our office received these orders. She can be reached [phone] fax number is [phone]. The reason for the order is that staff members reported that the patient is frequently choking during meals. Initial call taken by: Vedia Coffer,  February 05, 2010 9:20 AM  Follow-up for Phone Call        Spoke with Waynetta Sandy at [facility].  No orders were found .  They are refaxing order to machine in triage office.  Will forward to Dr Kriste Basque when order is recieved. Abigail Miyamoto RN  February 05, 2010 10:17 AM   Order received and placed on Dr Jodelle Green desk for review. Abigail Miyamoto RN  February 05, 2010 10:27 AM   Additional Follow-up for Phone Call Additional follow up Details #1::        orders have been signed by [facility] and faxed back.  placed orders in scan folder.  Randell Loop CMA  February 05, 2010 1:50 PM

## 2010-02-12 NOTE — Progress Notes (Signed)
Summary: constipated  Phone Note Call from Patient Call back at [facility] Phone [phone]   Caller: Patient Call For: nadel Reason for Call: Talk to Nurse Summary of Call: Patient requesting to speak to nurse.  He says he is badly constipated.  He took milk of magnesia last night and it has not helped. Initial call taken by: Lehman Prom,  February 06, 2010 9:00 AM  Follow-up for Phone Call        The patient says he has not had BM in four days. He says the home health nurse could hear gurgling and his abdomen is soft. He  denies any severe abdominal pain. Pls advise.Michel Bickers CMA  February 06, 2010 9:49 AM     NKDA  Additional Follow-up for Phone Call Additional follow up Details #1::        per SN----1 bottle of citrate of magnesium by mouth followed by lots of water by mouth    if no result by the evening, then take 4 dulcolax tabs at bedtime and dulcolax supp. in the am.  thanks Randell Loop CMA  February 06, 2010 11:16 AM   Pt is aware and wrote down all instructions per SN. He will call if he continues to have problems. Additional Follow-up by: Michel Bickers CMA,  February 06, 2010 11:34 AM

## 2010-02-12 NOTE — Progress Notes (Signed)
Summary: speak to nurse  Phone Note Call from Patient Call back at Home Phone [phone]   Caller: Patient Call For: Mercades Bajaj Reason for Call: Talk to Nurse Summary of Call: Pt states he needs to follow up with nurse in ref to phone call from yesterday. Initial call taken by: Darletta Moll,  February 07, 2010 8:31 AM  Follow-up for Phone Call        called and spoke with pt this am---he did call the on call this am about 5 due to  very loose stools----he stated that he has a big mess going on---he did take the mag citrate yesterday as instructed and at bedtime took the dulcolax tabs---he stated that the stool is just pouring out of him and he does not know what to do now.  he did void this am so that is not an issue.  please advise. Randell Loop CMA  February 07, 2010 9:17 AM   Additional Follow-up for Phone Call Additional follow up Details #1::        per SN----he will need to let this run its course and get out of his sytem and call for any concerns Randell Loop CMA  February 07, 2010 9:53 AM   Pt informed of above and states that does not help him. Pt states that he will call us back if worse. Zackery Barefoot CMA  February 07, 2010 10:08 AM

## 2010-02-14 ENCOUNTER — Telehealth (INDEPENDENT_AMBULATORY_CARE_PROVIDER_SITE_OTHER): Payer: Self-pay | Admitting: *Deleted

## 2010-02-17 ENCOUNTER — Other Ambulatory Visit: Payer: Self-pay | Admitting: Pulmonary Disease

## 2010-02-17 ENCOUNTER — Other Ambulatory Visit: Payer: Self-pay

## 2010-02-17 ENCOUNTER — Encounter (INDEPENDENT_AMBULATORY_CARE_PROVIDER_SITE_OTHER): Payer: Self-pay | Admitting: *Deleted

## 2010-02-17 DIAGNOSIS — Z125 Encounter for screening for malignant neoplasm of prostate: Secondary | ICD-10-CM

## 2010-02-17 DIAGNOSIS — I10 Essential (primary) hypertension: Secondary | ICD-10-CM

## 2010-02-17 DIAGNOSIS — E039 Hypothyroidism, unspecified: Secondary | ICD-10-CM

## 2010-02-17 DIAGNOSIS — R748 Abnormal levels of other serum enzymes: Secondary | ICD-10-CM

## 2010-02-17 DIAGNOSIS — E7800 Pure hypercholesterolemia, unspecified: Secondary | ICD-10-CM

## 2010-02-17 DIAGNOSIS — D649 Anemia, unspecified: Secondary | ICD-10-CM

## 2010-02-17 LAB — HEPATIC FUNCTION PANEL
ALT: 33 U/L (ref 0–53)
AST: 32 U/L (ref 0–37)
Alkaline Phosphatase: 54 U/L (ref 39–117)
Bilirubin, Direct: 0.2 mg/dL (ref 0.0–0.3)
Total Bilirubin: 0.9 mg/dL (ref 0.3–1.2)
Total Protein: 5.9 g/dL — ABNORMAL LOW (ref 6.0–8.3)

## 2010-02-17 LAB — CBC WITH DIFFERENTIAL/PLATELET
Basophils Relative: 0.3 % (ref 0.0–3.0)
Eosinophils Absolute: 0.2 10*3/uL (ref 0.0–0.7)
Eosinophils Relative: 2.3 % (ref 0.0–5.0)
Hemoglobin: 15.1 g/dL (ref 13.0–17.0)
Lymphocytes Relative: 25.9 % (ref 12.0–46.0)
MCV: 100.5 fl — ABNORMAL HIGH (ref 78.0–100.0)
Monocytes Relative: 10.8 % (ref 3.0–12.0)
Neutro Abs: 4.9 10*3/uL (ref 1.4–7.7)
Platelets: 188 10*3/uL (ref 150.0–400.0)
RBC: 4.43 Mil/uL (ref 4.22–5.81)

## 2010-02-17 LAB — LIPID PANEL: Total CHOL/HDL Ratio: 3

## 2010-02-17 LAB — BASIC METABOLIC PANEL
BUN: 22 mg/dL (ref 6–23)
GFR: 71 mL/min (ref >60.00)
Sodium: 138 mEq/L (ref 135–145)

## 2010-02-18 ENCOUNTER — Ambulatory Visit (INDEPENDENT_AMBULATORY_CARE_PROVIDER_SITE_OTHER): Payer: Medicare Other | Admitting: Pulmonary Disease

## 2010-02-18 ENCOUNTER — Encounter: Payer: Self-pay | Admitting: Pulmonary Disease

## 2010-02-18 DIAGNOSIS — Z77090 Contact with and (suspected) exposure to asbestos: Secondary | ICD-10-CM

## 2010-02-18 DIAGNOSIS — R1314 Dysphagia, pharyngoesophageal phase: Secondary | ICD-10-CM

## 2010-02-18 DIAGNOSIS — I679 Cerebrovascular disease, unspecified: Secondary | ICD-10-CM

## 2010-02-18 DIAGNOSIS — J309 Allergic rhinitis, unspecified: Secondary | ICD-10-CM

## 2010-02-18 DIAGNOSIS — D126 Benign neoplasm of colon, unspecified: Secondary | ICD-10-CM

## 2010-02-18 DIAGNOSIS — E785 Hyperlipidemia, unspecified: Secondary | ICD-10-CM

## 2010-02-18 DIAGNOSIS — R109 Unspecified abdominal pain: Secondary | ICD-10-CM

## 2010-02-18 DIAGNOSIS — I2510 Atherosclerotic heart disease of native coronary artery without angina pectoris: Secondary | ICD-10-CM

## 2010-02-18 DIAGNOSIS — K5730 Diverticulosis of large intestine without perforation or abscess without bleeding: Secondary | ICD-10-CM

## 2010-02-20 NOTE — Miscellaneous (Signed)
Summary: Treatment Plan/[facility]  Treatment Plan/[facility]   Imported By: Sherian Rein 02/10/2010 10:25:46  _____________________________________________________________________  External Attachment:    Type:   Image     Comment:   External Document

## 2010-02-20 NOTE — Miscellaneous (Signed)
Summary: Order/[facility]  Order/[facility]   Imported By: Sherian Rein 02/10/2010 10:27:02  _____________________________________________________________________  External Attachment:    Type:   Image     Comment:   External Document

## 2010-02-20 NOTE — Progress Notes (Signed)
Summary: Lab tests  Phone Note Call from Patient Call back at Home Phone [phone]   Caller: Patient Call For: nadel Summary of Call: Patient scheduled annual check-up with Dr. Kriste Basque on Tuesday afternoon, 2/14.  He wants to get labs done prior, so please put lab order in for him asap. Initial call taken by: Leonette Monarch,  February 14, 2010 11:57 AM  Follow-up for Phone Call        Dr. Kriste Basque, pls advise which labs pt needs.  Thanks! Crystal Jones RN  February 14, 2010 1:50 PM  PER SN, order labs:  FLP 272.0, BMET 401.9,LFT 790.5, CBCD 285.9, TSH 244.9, PSA V76.44. Pt is aware and will come in on Mon., 2/13 for the fasting labs. Will put appt in EPIC for labs.Michel Bickers [facility]  February 14, 2010 3:58 PM

## 2010-02-26 NOTE — Miscellaneous (Signed)
Summary: [facility] & d/c ST order/[facility]  [facility] & d/c ST order/[facility]   Imported By: Sherian Rein 02/17/2010 11:58:27  _____________________________________________________________________  External Attachment:    Type:   Image     Comment:   External Document

## 2010-02-26 NOTE — Miscellaneous (Signed)
Summary: ST order/[facility]  ST order/[facility]   Imported By: Sherian Rein 02/17/2010 12:00:00  _____________________________________________________________________  External Attachment:    Type:   Image     Comment:   External Document

## 2010-03-04 ENCOUNTER — Encounter: Payer: Self-pay | Admitting: Sports Medicine

## 2010-03-04 ENCOUNTER — Ambulatory Visit (INDEPENDENT_AMBULATORY_CARE_PROVIDER_SITE_OTHER): Payer: Medicare Other | Admitting: Sports Medicine

## 2010-03-04 DIAGNOSIS — M79609 Pain in unspecified limb: Secondary | ICD-10-CM
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
## 2010-03-13 NOTE — Assessment & Plan Note (Signed)
Summary: FU PER DR. Darneshia Demary/MJD   Vital Signs:  [name] profile:   77 year old male BP sitting:   104 / 59  Vitals Entered By: Lillia Pauls CMA (March 04, 2010 11:11 AM)  Referring Provider:  n/a Primary Provider:  Alroy Dust, MD   History of Present Illness: 77 year old [name] w hx of hip pain on left raditing into ant thigh hx of THR in 2004 this has been functional  however past year pain is worse over left ant thigh sharp pain radiates down his ant leg particularly w getting our of a car sometimes w getting out of chair  no new injury he is very careful and denies any falls has used a cane in RT hand for sometime to help w balance issues orthotpedic eval did not find any displacement of prosthesis  on last visit I tried him on some tramadol but this led to bad consitipation This may have helped pain some but since he does not have pain all of the time was not worth the side effects  has tried PT but that did not help  Allergies: No Known Drug Allergies  Physical Exam  General:  Well-developed,well-nourished,in no acute distress; alert,appropriate and cooperative throughout examination Msk:  seated ROM of hips is very good in ER/IR left and RT reach 60 deg total lying at 90 flex hip and 90 deg flex knee he gets abotu 70 deg rotation motion bilat  hip flexion on RT is 110 deg on left he can only get to 90 deg flexion and this causes some pain strength is good for his age onflex and abduction  gait is shuffling but with normal lift hips bilat   Impression & Recommendations:  Problem # 1:  LEG PAIN, LEFT (ICD-729.5)  Orders: Garment,belt,sleeve or other covering ,elastic or similar stretch (Q4696)  There could be an element of meralgia paresthetica - hard to assess in this gentleman However, there is some clear limitation of full flexion of left hip that is not unexpected after having had THR for 8 years I think this contributes to spasm in upper quad and  vast lateralis in left thigh  I would not want to start much more medication will try him on Vit B6 for any nerve injury  use a compressino sleeve on thgh for several hours per day to see if this helps  if no relief send him to PMR doctors for their advice  Complete Medication List: 1)  Clarinex 5 Mg Tabs (Desloratadine) .... Take 1 tablet by mouth once a day 2)  Bayer Low Strength 81 Mg Tbec (Aspirin) .... Take 1 tablet by mouth once a day 3)  Isosorbide Mononitrate Cr 30 Mg Tb24 (Isosorbide mononitrate) .... Take 1 tablet  by mouth once daily 4)  Metoprolol Succinate 25 Mg Xr24h-tab (Metoprolol succinate) .... Take 1 tab by mouth once daily.Marland KitchenMarland Kitchen 5)  Furosemide 20 Mg Tabs (Furosemide) .... Take 1 tablet by mouth once a day 6)  Lipitor 20 Mg Tabs (Atorvastatin calcium) .... Take 1/2 tablets by mouth once daily 7)  Fish Oil 500 Mg Caps (Omega-3 fatty acids) .... Take 1 capsule by mouth once a day 8)  Co Q-10 200 Mg Caps (Coenzyme q10) .... Take 1 tabs by mouth once daily 9)  Align Caps (Probiotic product) .... One tablet by mouth once daily 10)  Miralax Powd (Polyethylene glycol 3350) .[name].. 1 capful in water daily 11)  Glucosamine-chondroitin-msm 500-250-250 Mg Caps (Glucosamine-chondroitin-msm) .[name].. 1 tablet three times  a day 12)  Multivitamins Tabs (Multiple vitamin) .... Take 1 tablet by mouth once a day 13)  Vitamin D3 2000 Unit Caps (Cholecalciferol) .... Once daily 14)  Calcium-magnesium-zinc 333-133-5 Mg Tabs (Calcium-magnesium-zinc) .... Take one by mouth four times a day 15)  Vitamin B-6 50 Mg Tabs (Pyridoxine hcl) .... Take one tab bid  [name] Instructions: 1)  Try to use compression sleeve to stop muscle spasm in left thigh 2)  use several hours each day 3)  the femoral nerve gets some irritation but you also have scar tissue from old hip replacment that limits your hip flexion 4)  also try taking Vitamin B 6 50 mgm twice daily 5)  if not better in 4 weeks I want to send you to  physical medicine physician Prescriptions: VITAMIN B-6 50 MG TABS (PYRIDOXINE HCL) take one tab bid  #60 x 2   Entered by:   Rochele Pages RN   Authorized by:   Enid Baas MD   Signed by:   Rochele Pages RN on 03/04/2010   Method used:   Electronically to        [facility] (retail)       [address] [place], [place]  [address]       Ph: [phone]       Fax: [phone]   RxID:   [id number]    Orders Added: 1)  Garment,belt,sleeve or other covering ,elastic or similar stretch [A4466] 2)  Est. [name] Level III [84132]

## 2010-03-17 ENCOUNTER — Encounter: Payer: Self-pay | Admitting: *Deleted

## 2010-03-20 LAB — POCT I-STAT, CHEM 8
BUN: 36 mg/dL — ABNORMAL HIGH (ref 6–23)
Calcium, Ion: 1.12 mmol/L (ref 1.12–1.32)
Chloride: 99 mEq/L (ref 96–112)
Glucose, Bld: 109 mg/dL — ABNORMAL HIGH (ref 70–99)

## 2010-03-24 LAB — HEPATIC FUNCTION PANEL
ALT: 66 U/L — ABNORMAL HIGH (ref 0–53)
Albumin: 3.4 g/dL — ABNORMAL LOW (ref 3.5–5.2)
Indirect Bilirubin: 0.9 mg/dL (ref 0.3–0.9)
Total Protein: 6 g/dL (ref 6.0–8.3)

## 2010-03-24 LAB — COMPREHENSIVE METABOLIC PANEL
AST: 37 U/L (ref 0–37)
Albumin: 3.3 g/dL — ABNORMAL LOW (ref 3.5–5.2)
Calcium: 8.6 mg/dL (ref 8.4–10.5)
Creatinine, Ser: 0.9 mg/dL (ref 0.4–1.5)
GFR calc Af Amer: >60 mL/min (ref >60)
Total Protein: 5.9 g/dL — ABNORMAL LOW (ref 6.0–8.3)

## 2010-03-24 LAB — DIFFERENTIAL
Basophils Absolute: 0 10*3/uL (ref 0.0–0.1)
Basophils Relative: 0 % (ref 0–1)
Eosinophils Absolute: 0.1 10*3/uL (ref 0.0–0.7)
Eosinophils Relative: 1 % (ref 0–5)
Eosinophils Relative: 1 % (ref 0–5)
Lymphocytes Relative: 12 % (ref 12–46)
Lymphocytes Relative: 12 % (ref 12–46)
Lymphs Abs: 1 10*3/uL (ref 0.7–4.0)
Lymphs Abs: 1.2 10*3/uL (ref 0.7–4.0)
Lymphs Abs: 1.5 10*3/uL (ref 0.7–4.0)
Monocytes Absolute: 1.1 10*3/uL — ABNORMAL HIGH (ref 0.1–1.0)
Neutro Abs: 9.7 10*3/uL — ABNORMAL HIGH (ref 1.7–7.7)
Neutrophils Relative %: 76 % (ref 43–77)
Neutrophils Relative %: 82 % — ABNORMAL HIGH (ref 43–77)

## 2010-03-24 LAB — CBC
HCT: 39.6 % (ref 39.0–52.0)
MCHC: 34.9 g/dL (ref 30.0–36.0)
MCHC: 35.1 g/dL (ref 30.0–36.0)
MCV: 98.2 fL (ref 78.0–100.0)
MCV: 98.9 fL (ref 78.0–100.0)
Platelets: 132 10*3/uL — ABNORMAL LOW (ref 150–400)
Platelets: 143 10*3/uL — ABNORMAL LOW (ref 150–400)
RBC: 4.18 MIL/uL — ABNORMAL LOW (ref 4.22–5.81)
RDW: 13.5 % (ref 11.5–15.5)
WBC: 12.7 10*3/uL — ABNORMAL HIGH (ref 4.0–10.5)

## 2010-03-24 LAB — CARDIAC PANEL(CRET KIN+CKTOT+MB+TROPI)
CK, MB: 2.9 ng/mL (ref 0.3–4.0)
Relative Index: 1.9 (ref 0.0–2.5)
Relative Index: 3.1 — ABNORMAL HIGH (ref 0.0–2.5)
Total CK: 149 U/L (ref 7–232)
Troponin I: 0.02 ng/mL (ref 0.00–0.06)
Troponin I: 0.02 ng/mL (ref 0.00–0.06)
Troponin I: 0.02 ng/mL (ref 0.00–0.06)

## 2010-03-24 LAB — BASIC METABOLIC PANEL
BUN: 21 mg/dL (ref 6–23)
CO2: 29 mEq/L (ref 19–32)
Calcium: 8.8 mg/dL (ref 8.4–10.5)
Chloride: 100 mEq/L (ref 96–112)
Chloride: 97 mEq/L (ref 96–112)
Creatinine, Ser: 1 mg/dL (ref 0.4–1.5)
Creatinine, Ser: 1.02 mg/dL (ref 0.4–1.5)
GFR calc Af Amer: >60 mL/min (ref >60)
GFR calc non Af Amer: >60 mL/min (ref >60)
Glucose, Bld: 85 mg/dL (ref 70–99)
Potassium: 4.2 mEq/L (ref 3.5–5.1)

## 2010-03-24 LAB — SEDIMENTATION RATE: Sed Rate: 10 mm/hr (ref 0–16)

## 2010-03-24 LAB — URINALYSIS, ROUTINE W REFLEX MICROSCOPIC
Ketones, ur: 15 mg/dL — AB
Nitrite: NEGATIVE
Specific Gravity, Urine: 1.008 (ref 1.005–1.030)
pH: 8 (ref 5.0–8.0)

## 2010-03-24 LAB — MRSA PCR SCREENING: MRSA by PCR: NEGATIVE

## 2010-03-24 LAB — HEMOGLOBIN A1C: Hgb A1c MFr Bld: 5.3 % (ref <5.7)

## 2010-03-24 LAB — TROPONIN I: Troponin I: 0.02 ng/mL (ref 0.00–0.06)

## 2010-03-24 LAB — CK TOTAL AND CKMB (NOT AT ARMC): Relative Index: INVALID (ref 0.0–2.5)

## 2010-03-24 LAB — MAGNESIUM: Magnesium: 2.1 mg/dL (ref 1.5–2.5)

## 2010-03-24 LAB — POCT CARDIAC MARKERS

## 2010-03-24 LAB — PROTIME-INR
INR: 1.15 (ref 0.00–1.49)
Prothrombin Time: 14.6 seconds (ref 11.6–15.2)

## 2010-03-24 LAB — CORTISOL-AM, BLOOD: Cortisol - AM: 12.4 ug/dL (ref 4.3–22.4)

## 2010-03-24 LAB — LIPID PANEL
Cholesterol: 109 mg/dL (ref 0–200)
HDL: 52 mg/dL (ref >39)
Triglycerides: 37 mg/dL (ref <150)

## 2010-04-03 ENCOUNTER — Telehealth: Payer: Self-pay | Admitting: *Deleted

## 2010-04-03 NOTE — Telephone Encounter (Signed)
pt wanted to inform doc that thigh helix did help alot. still has some stiffness but overall feeling good. calling because dr told him to fu in one mo via phone   By Lillia Pauls, CMA

## 2010-04-10 ENCOUNTER — Telehealth: Payer: Self-pay | Admitting: Sports Medicine

## 2010-04-10 NOTE — Telephone Encounter (Addendum)
Message copied by Enid Baas on Thu Apr 10, 2010  9:17 AM ------      Message from: Lillia Pauls      Created: Tue Apr 08, 2010  4:11 PM       Pt wants you to call him re: last note in chart  I called this patient and suggested that he can use the compression sleeve as much as he needs and as much as is comfortable. I left a message to call back if he has other questions.

## 2010-05-07 ENCOUNTER — Other Ambulatory Visit: Payer: Self-pay | Admitting: Cardiology

## 2010-05-13 ENCOUNTER — Encounter: Payer: Self-pay | Admitting: Cardiology

## 2010-05-13 DIAGNOSIS — I340 Nonrheumatic mitral (valve) insufficiency: Secondary | ICD-10-CM | POA: Insufficient documentation

## 2010-05-13 DIAGNOSIS — Z951 Presence of aortocoronary bypass graft: Secondary | ICD-10-CM | POA: Insufficient documentation

## 2010-05-13 DIAGNOSIS — I358 Other nonrheumatic aortic valve disorders: Secondary | ICD-10-CM | POA: Insufficient documentation

## 2010-05-13 DIAGNOSIS — R001 Bradycardia, unspecified: Secondary | ICD-10-CM | POA: Insufficient documentation

## 2010-05-13 DIAGNOSIS — R55 Syncope and collapse: Secondary | ICD-10-CM | POA: Insufficient documentation

## 2010-05-13 DIAGNOSIS — I2510 Atherosclerotic heart disease of native coronary artery without angina pectoris: Secondary | ICD-10-CM | POA: Insufficient documentation

## 2010-05-14 ENCOUNTER — Ambulatory Visit (INDEPENDENT_AMBULATORY_CARE_PROVIDER_SITE_OTHER): Payer: Medicare Other | Admitting: Cardiology

## 2010-05-14 ENCOUNTER — Encounter: Payer: Self-pay | Admitting: Cardiology

## 2010-05-14 DIAGNOSIS — I2510 Atherosclerotic heart disease of native coronary artery without angina pectoris: Secondary | ICD-10-CM

## 2010-05-14 DIAGNOSIS — R55 Syncope and collapse: Secondary | ICD-10-CM

## 2010-05-14 NOTE — Assessment & Plan Note (Signed)
Coronary disease is stable. No change in therapy. 
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
## 2010-05-14 NOTE — Patient Instructions (Signed)
Your physician wants you to follow-up in: 5 months You will receive a reminder letter in the mail two months in advance. If you don't receive a letter, please call our office to schedule the follow-up appointment.  

## 2010-05-14 NOTE — Assessment & Plan Note (Signed)
Patient has not had any recurrent presyncope.  No change in therapy.

## 2010-05-20 NOTE — Assessment & Plan Note (Signed)
[facility]ALTHCARE                            CARDIOLOGY OFFICE NOTE   NAME:WECHSLERMare, Ludtke                     MRN:          [id number]  DATE:11/10/2007                            DOB:          04/22/1916    Mr. Shutt has known coronary disease with remote CABG.  Unfortunately, recently he has had a change in his symptomatology as he  is having more frequent chest discomfort.  He is not caring  nitroglycerin.  Many, many years ago, he tried 3 nitroglycerin in a row  and became presyncopal and has not used nitro since.  I think it will be  safe for him to use an intermittent single dose of nitroglycerin and we  will give him a prescription.  He describes his symptoms usually when  walking.  He had some mild discomfort when he came in from the parking  lot today.  In addition, he feels it sometimes when alone and walking in  the hallway at [facility].  However, the other night when out with  Peggy and walking downtown he did not have them.  He wonders if it could  be psychological, but it does appear to be exertion.  He also has not  been on a beta-blocker because of the history of some bradycardia.   He has not had any syncope.  He has no major shortness of breath.   ALLERGIES:  No known drug allergies.   MEDICATIONS:  Aspirin, multivitamin, Clarinex, Lasix, fish oil,  glucosamine, coenzyme Q10, MiraLax, Lipitor, Lutein, calcium, vitamin D,  melatonin, and Imdur 15 mg.   OTHER MEDICAL PROBLEMS:  See the list below.   REVIEW OF SYSTEMS:  He is not having any GI or GU symptoms.  He has no  headaches.  He is not having any fevers or chills.  His review of  systems otherwise is negative.   PHYSICAL EXAMINATION:  Blood pressure is 150/68 with a pulse of 60.  Weight is 134 pounds.  The patient is oriented to person, time, and  place.  Affect is normal.  HEENT reveals no xanthelasma.  He has normal  extraocular motion.  There are no carotid bruits.   There is no jugular  venous distention.  Lungs are clear.  Respiratory effort is not labored.  Cardiac exam reveals an S1 with an S2.  There are no clicks or  significant murmurs.  The abdomen is soft.  He has no peripheral edema.   Hemoglobin in the past week is 15 with a BUN of 14 and creatinine 1.1  and potassium 4.0.   Mr. Agcaoili speech is slow, but his mental capacity is fully intact.  I have carefully discussed the issue of his worsening angina with him.  We talked about possible exercise testing, and we talked about possible  catheterization.  I explained to him that if a cath showed a major area,  then he could possibly receive a stent.  He will keep this in mind.  However, he made it quite clear that he absolutely does not want to go  into the hospital unless it is absolutely  necessary.   Problems include:  1. Coronary disease with coronary artery bypass graft in 1985 range.      I suspect that his symptoms now are from angina.  We will do      everything possible to help treat him with medications.  Today's      change will be to add a very small amount of metoprolol.  Also, he      will be allowed to carry nitroglycerin and to use only 1 dose at a      time.  I will see him back for early followup.  We will consider an      adenosine Myoview scan at some point to see if there has been a      major change.  2. History of minimal carotid disease in 2008.  There is no need for      repeat testing at this time.  3. History of mild bradycardia.  Despite this, we will try a very low      dose of beta-blockade because his symptoms are with exercise.  4. Urgent right inguinal hernia repair done in [place] in the past      couple of years.  5. Leg pain which we feel is not vascular in origin.  6. Recent significant diarrhea which is now treated and resolved.  7. Old right bundle-branch block.  8. History of spine disease by films done in [place] in the past and      outlined  in prior note.  9. Status post hip replacement   We will add the medicines as outlined above, and I will see him for  early followup.     Luis Abed, MD, [facility]  Electronically Signed    JDK/MedQ  DD: 11/10/2007  DT: 11/11/2007  Job #: [id number]

## 2010-05-20 NOTE — Assessment & Plan Note (Signed)
[facility]ALTHCARE                            CARDIOLOGY OFFICE NOTE   NAME:[name], [name]                     MRN:          [id number]  DATE:01/27/2007                            DOB:          Aug 09, 1916    [name] is doing well.  He recently saw Dr. Kriste Basque in the office  earlier this week.  He appears to be stable.  There was some question of  a hernia, but at this point, we think this is stable.  He is not having  any significant chest pain.  He walked in rapidly from the outside today  and had some shortness of breath, but he is stable.  He is looking  forward to traveling by airplane to [place] next week.  He underwent  CABG approximately 23-24 years ago at [facility].   PAST MEDICAL HISTORY:   ALLERGIES:  He does not feel well with nitroglycerin.   MEDICATIONS:  Aspirin, multivitamin, Clarinex, Lasix, fish oil, Imdur,  glucosamine, Lipitor and vitamins.   OTHER MEDICAL PROBLEMS:  See the list on my note of August 05, 2006.   REVIEW OF SYSTEMS:  He is feeling well as outlined.   PHYSICAL EXAMINATION:  VITAL SIGNS:  Blood pressure is 135/59 with a  pulse of 63.  The patient is oriented to person, time and place.  Affect  is normal.  HEENT:  Reveals no xanthelasma.  He has normal extraocular motion.  There are no carotid bruits.  There is no jugular venous distention.  LUNGS:  Clear.  Respiratory effort is not labored.  CARDIAC:  Exam reveals S1-S2.  There are no clicks or significant  murmurs.  He has no peripheral edema.   ASSESSMENT:  Problems are listed on the note of August 05, 2006.  #1.  Coronary disease stable.   He is quite stable.  He will travel to [place].  I will see him in  follow-up.     Luis Abed, MD, [facility]  Electronically Signed    JDK/MedQ  DD: 01/27/2007  DT: 01/27/2007  Job #: [id number]

## 2010-05-20 NOTE — Assessment & Plan Note (Signed)
[facility] HEALTHCARE                            CARDIOLOGY OFFICE NOTE   NAME:WECHSLERPilot, Prindle                     MRN:          [id number]  DATE:08/05/2006                            DOB:          Mar 15, 1916    Mr. Garn is here for cardiology followup. He is 77 years old. He  remains active. He underwent CABG 23 years ago at [facility]. He has remained  stable. He does have right bundle with right axis deviation. He has had  some bradycardia in the past and his beta blockers have been held. He  remains stable. He does have some exertional shortness of breath that  has not changed. Recent labs checked by Dr. Corinda Gubler revealed that his  SGOT and SGPT were normal. Triglycerides were 49, with an HDL of 46, and  an LDL of 77 on 10 mg of Lipitor. He is here now to establish his care  with me as Dr. Gabriel Rung has retired.   PAST MEDICAL HISTORY:   ALLERGIES:  No known drug allergies.   He has not felt well with nitroglycerine in the past.   MEDICATIONS:  1. Aspirin 81.  2. Multivitamin.  3. Clarinex.  4. Lasix 20.  5. Fish oil.  6. Imdur 15 b.i.d.  7. Glucosamine.  8. Vitamins.   OTHER MEDICAL PROBLEMS:  See the list below.   REVIEW OF SYSTEMS:  He is feeling well and has no complaints.  Review of  systems is negative.   PHYSICAL EXAMINATION:  Blood pressure is 122/76 with a pulse of 68.  Patient is oriented to person, time, and place. Affect is normal.  HEENT: Reveals no xanthelasma. He has normal extraocular motion. There  are no carotid bruits. There is no jugular venous distension.  LUNGS: Clear. Respiratory effort is not labored.  CARDIAC EXAM: Reveals an S1 with an S2. There are no clicks or  significant murmurs.  ABDOMEN: Soft. There are no masses or bruits. There is no peripheral  edema.   EKG is not today. When done in April it revealed a right bundle branch  block with left axis.   PROBLEMS:  Include;  1. Coronary disease, post coronary artery  bypass grafting 23 years      ago.  2. Very minimal carotid disease documented by Doppler in February of      2008.  3. No evidence of abdominal aortic aneurysm by study in February of      2005.  4. Myoview in 2007 revealed question of mild ischemia at the base of      the anterolateral wall with no change and no further study is need      at this time.  5. Bradycardia and therefore he is not on beta blockers.  6. Right bundle branch block with left axis and this is old.   He is stable. He does not need any tests and I will see him back for  follow up.     Luis Abed, MD, [facility]  Electronically Signed    JDK/MedQ  DD: 08/05/2006  DT: 08/06/2006  Job #: [id number] [id number]

## 2010-05-20 NOTE — Assessment & Plan Note (Signed)
[facility] HEALTHCARE                            CARDIOLOGY OFFICE NOTE   NAME:[name], [name]                     MRN:          [id number]  DATE:10/18/2007                            DOB:          11-07-1916    [name] is here for followup.  He is an active pleasant 77 year old  gentleman.  Recently, he has had significant diarrhea and he has been  getting help from Dr. Arlyce Dice for this.  This has weakened him.  Also  during this period he mentions that he has had some increase in his  chest discomfort.  He has not had any syncope or presyncope.  He has  discomfort in his legs.  This was discussed at his last visit.  We did  arterial evaluation and his pulses were excellent.  He had normal ABIs.  Therefore, there is no evidence of significant vascular disease in his  leg.  Also some labs were drawn.  He had been questioning his B12 level.  This was normal.  His lipids results are excellent.   PAST MEDICAL HISTORY:   ALLERGIES:  No known drug allergies.   MEDICATIONS:  1. Aspirin 81.  2. Clarinex 5.  3. Lasix 20.  4. Fish oil.  5. Imdur 15 b.i.d. (to be changed to 30 b.i.d.).  6. Glucosamine.  7. MiraLax.  8. Lipitor 10.  9. Lutein.  10.Vitamin D.  11.Melatonin.  12.Short course of ciprofloxacin with a last dose on October 19, 2007.   OTHER MEDICAL PROBLEMS:  See the list on my note of September 28, 2007.   REVIEW OF SYSTEMS:  He has some ecchymosis on his hand.  I believe this  is natural for him with his age and aspirin therapy.  He talked about  his diarrhea.  It is improving.  Otherwise his review of systems is  negative.   PHYSICAL EXAMINATION:  VITAL SIGNS:  Blood pressure is a 104/64 with a  pulse of 66.  Weight is 133 pounds.  GENERAL:  The patient is oriented to person, time and place.  Affect is  normal.  HEENT:  No xanthelasma.  He has normal extraocular motion.  NECK:  There are no carotid bruits.  There is no jugular venous  distention.  He does have kyphosis of the spine.  LUNGS:  Clear.  Respiratory effort is not labored.  CARDIAC:  An S1 with an S2.  There are no clicks or significant murmurs.  His rhythm is sinus with PACs.  He has old right bundle-branch block.   He brought me report of x-rays that were done in [place] in August 2009.  Results included T11 spondylosis, L1 and L2 spondylosis, L3-L5 disk  disease prominent at L5, extensive costal cartilage calcification,  abdominal aortic calcification extending to the femoral arteries, no  dilatation noted, bipolar hip replacement, lumbar spine hypolordosis.  Review of recent blood studies reveal a BUN slightly elevated at 1.4.  He will follow this up with Dr. Arlyce Dice.  Alk phos is normal.  SGOT and  PT are normal.  The LDL is 70, HDL 46, triglycerides 49 and  B12 was in  the normal range of 707.   PROBLEMS:  1. History of coronary artery disease, post CABG 24 years ago.  Today      is the first time he said anything to me about chest discomfort.      He says that while he has had the diarrhea, he has noted that if he      tries to walk he has been getting some discomfort.  We will      increase his Imdur slightly because the dose is so low.  I will      then see him back to follow up to decide if we need to be more      aggressive.  2. History of minimal carotid disease in 2008.  3. Question of mild ischemia at the base of the anteroseptal wall in      2007 and I will consider a followup study when I see him back.  4. Mild bradycardia.  Therefore no beta-blocker.  5. Old right bundle-branch block.  6. Urgent right inguinal hernia that was done successfully in [place]      in the past couple of years.  7. Leg pain.  This is probably hip discomfort.  He does not have      significant vascular disease.  8. Recent diarrhea that has been significant being managed by Dr.      Arlyce Dice.   His Imdur dose will be increased and I will see him for  followup.     Luis Abed, MD, [facility]  Electronically Signed    JDK/MedQ  DD: 10/18/2007  DT: 10/19/2007  Job #: [id number]   cc:   Lonzo Cloud. Kriste Basque, MD

## 2010-05-20 NOTE — Assessment & Plan Note (Signed)
[facility] HEALTHCARE                            CARDIOLOGY OFFICE NOTE   NAME:[name], [name]                     MRN:          [id number]  DATE:11/02/2006                            DOB:          May 06, 1916    Mr. Pha continues to do well.  He has coronary disease with  coronary artery bypass grafting 23 years ago at [facility].  He is stable.  His lipids are stable.  He is not having any chest pain, syncope,  presyncope, or shortness of breath.   PAST MEDICAL HISTORY:   ALLERGIES:  No known drug allergies.   MEDICATIONS:  1. Aspirin.  2. Clarinex.  3. Lasix.  4. Fish oil.  5. Imdur.  6. Glucosamine.  7. MiraLax.  8. Lipitor.   OTHER MEDICAL PROBLEMS:  See my list of August 05, 2006.   REVIEW OF SYSTEMS:  He has a slight upper respiratory infection which is  improving, otherwise review of systems is negative.   PHYSICAL EXAMINATION:  Blood pressure is as of today 147/69.  This is a  little higher than we have seen it.  His pulse is 66.  We will see him  back in 3 months and be sure to review his blood pressure.  He patient is oriented to person, time, and place and affect is normal.  HEENT:  Reveals no xanthelasma.  He has normal extraocular  motion.  There are no carotid bruits.  There is no jugular venous distension.  LUNGS:  Clear.  Respiratory effort is not labored.  CARDIAC EXAM:  Reveals an S1 with an S2.  There are no clicks or  significant murmurs.  His abdomen is soft.  He has no masses or bruits.  There is no significant peripheral edema.   EKG reveals his old right bundle branch block.   Problems listed August 05, 2006 note.  1. Coronary disease, stable.   No change in his medicines.  I will see him back in 3 months.     Luis Abed, MD, [facility]  Electronically Signed    JDK/MedQ  DD: 11/02/2006  DT: 11/03/2006  Job #: [id number]

## 2010-05-20 NOTE — Assessment & Plan Note (Signed)
[facility] HEALTHCARE                            CARDIOLOGY OFFICE NOTE   NAME:[name], Latin                     MRN:          [id number]  DATE:09/28/2007                            DOB:          December 17, 1916    [name] called saying that he was having pain in his legs.  On the  Internet he heard that B12 might be involved.  He also was interested in  the possibility of arterial Dopplers.  I felt it was most appropriate to  see him in person to try to decide how to proceed.  He was added onto  the schedule today.  He describes varied types of symptoms in his legs.  He says that sometimes while walking he will have pain in his legs that  occurs from his ankles to his hips in both legs.  He then has to sit  down and this symptom might last for 30 minutes or longer.  On the other  hand, he might be sitting for a prolonged period of time and then  develop the same discomfort.  In that case if he gets up and walks  around, he feels better.   An additional symptom is burning in his leg that occurs at night.  The  quality of this pain does not appear to be the same as his other pain.   The patient also tells me that he saw a chiropractor with several  sessions in the past month or 2 and then an x-ray was done.   ALLERGIES:  No known drug allergies.   MEDICATIONS:  Aspirin, multivitamin, Clarinex, Lasix, fish oil, Imdur,  glucosamine, coenzyme Q10, MiraLax Lipitor, Lutein, vitamin D, and  melatonin.   OTHER MEDICAL PROBLEMS:  See the list below.   REVIEW OF SYSTEMS:  Other than the HPI, his review of systems today is  negative.   PHYSICAL EXAMINATION:  VITAL SIGNS:  Weight is 132 pounds.  Blood  pressure is 122/64 with a pulse of 60.  GENERAL:  The patient is oriented to person, time, and place.  Affect is  normal.  HEENT:  Reveals no xanthelasma.  He has normal extraocular motion.  NECK:  There are no carotid bruits.  There is no jugular venous  distention.  LUNGS:  Clear.  Respiratory effort is not labored.  CARDIAC:  Reveals an S1 with an S2.  There are no clicks or significant  murmurs.  ABDOMEN:  Soft.  EXTREMITIES:  There is no peripheral edema.  He has 2+ posterior tibial  pulses bilaterally.   PROBLEMS:  1. History of coronary disease post coronary artery bypass grafting 24      years ago at [facility].  2. Minimal carotid disease by Doppler in 2008.  3. No evidence of abdominal aortic aneurysm in 2005.  4. Myoview in 2007 with question of mild ischemia at the base of the      anterolateral wall with no change and this is followed.  5. Mild bradycardia and therefore no beta-blocker.  6. Old right bundle-branch block.  7. Need for urgent right inguinal hernia that was done  successfully in      [place] in the past year or two.  8. Current leg pain as described above.  The exact etiology is not      clear.  He has pain from his ankles to his hips bilaterally.      Sometimes this occurs after walking, but sometimes it occurs after      sitting.  I am not convinced that it represents claudication, but      we need to rule this out.  He also has some leg burning at night.   We will check a B12 level at his request and we will obtain arterial  Dopplers and then I will see him for followup.     Luis Abed, MD, [facility]  Electronically Signed    JDK/MedQ  DD: 09/28/2007  DT: 09/29/2007  Job #: [id number]   cc:   Lonzo Cloud. Kriste Basque, MD

## 2010-05-20 NOTE — Assessment & Plan Note (Signed)
OFFICE VISIT   [name], [name]  DOB:  1916-07-10                                       09/26/2009  QMVHQ#:[id number]   CHIEF COMPLAINT:  Left leg pain.   HISTORY OF PRESENT ILLNESS:  The patient is a 77 year old male referred  by Dr. Eulah Pont for evaluation of left thigh pain.  The patient states he  has occasional sharp pain in his left leg between the thigh and groin.  He states that this has been intermittently going on for several months.  He states that the pain is exacerbated when he tries to get out of bed  or gets in and out of the car.  He is minimally ambulatory according to  his friend that was with him today.  He currently lives at [facility].  He primarily on transfers bed to chair.  He has had a history of  previous left hip replacement.  He does not really describe claudication  symptoms but is not really ambulatory enough to develop claudication.  He denies any rest pain and denies any nonhealing ulcerations in his  lower extremities.   Chronic medical problems include coronary artery disease with previous  myocardial infarction in 1985.  He also has a history of  congestive  heart failure.  He also has a history of elevated cholesterol and  hypertension.  All of these are currently controlled and followed by Dr.  Kriste Basque.   SOCIAL HISTORY:  He is retired.  He has 3 children.  He is a nonsmoker,  nonconsumer of alcohol.  He is widowed.   FAMILY HISTORY:  Unremarkable.   A full 12 point review of systems was performed with the patient today.  Please see intake referral form for details regarding this.   MEDICATIONS:  Include furosemide, Clarinex, Lipitor, Imdur,  multivitamin, glucosamine chondroitin, vitamin C, calcium, magnesium,  phosphorous, vitamin D, Co-Q 10, fish oil, Lutein, vitamin D, aspirin,  MiraLAX, metoprolol, Activia and Colace.   ALLERGIES:  He has no known drug allergies.   PHYSICAL EXAMINATION:  Blood  pressure is 125/72 in the left arm, oxygen  saturation is 95% on room air, heart rate is 51 and regular.  HEENT:  Unremarkable.  Neck:  Has 2+ carotid pulses without bruit.  Chest:  Clear to auscultation.  Cardiac:  Regular rate and rhythm without  murmur.  Abdomen:  Soft, nontender, nondistended.  No masses.  Extremities:  Reveals 2+ radial, 1+ femoral and 1+ posterior tibial  pulses bilaterally.  He has no point tenderness in the left thigh.  He  has no significant lower extremity edema.  He has no obvious major joint  deformities.  Neurologic exam:  Shows symmetric upper extremity, lower  extremity and motor strength which is 5/5.  Skin:  Has no open ulcers or  rashes.   He had bilateral ABIs performed today which were 1.12 on the right and  1.09 on the left.  Waveforms were biphasic to triphasic in character  which were essentially normal.   In summary, the patient has left leg pain.  He does not have evidence of  arterial or venous disease that could be an explanation for this pain.  It is primarily thigh pain in nature.  I am not sure the exact etiology  but it seems more musculoskeletal in origin rather than vascular.  I  discussed all this with the patient and his friend today.  He will  follow up with me on an as-needed basis.     Janetta Hora. Fields, MD  Electronically Signed   CEF/MEDQ  D:  09/26/2009  T:  09/27/2009  Job:  [id number]   cc:   Loreta Ave, M.D.  Lonzo Cloud. Kriste Basque, MD

## 2010-05-20 NOTE — Assessment & Plan Note (Signed)
[facility]                            CARDIOLOGY OFFICE NOTE   NAME:[name], [name]                     MRN:          [id number]  DATE:12/06/2007                            DOB:          04-Oct-1916    [name] is here for Cardiology followup.  When I saw him last on  November 10, 2007, I gave him permission to use p.r.n. nitroglycerin.  I  also started him on a low dose of a beta-blocker.  Since that time, it  appears that he is not having any significant chest pain.  He has not  used a nitroglycerin.  It seems that he may in fact be helped by the low-  dose beta-blocker.  We will continue this.   PAST MEDICAL HISTORY:   ALLERGIES:  No known drug allergies.   MEDICATIONS:  See the medication list.   REVIEW OF SYSTEMS:  He mentions that his low back pain and leg pain  continue to be his major problem.  We had checked in to whether or not  this was vascular in origin and we felt that it was not.  I have  encouraged him to see Dr. Kriste Rice in followup.  Otherwise, review of  systems is negative.   PHYSICAL EXAMINATION:  VITAL SIGNS:  Blood pressure 118/70 with a pulse  of 56.  GENERAL:  The patient is oriented to person, time, and place.  Affect is  normal.  HEENT:  No xanthelasma.  He has normal extraocular motion.  NECK:  There are no carotid bruits.  There is no jugular venous  distention.  LUNGS:  Clear.  Respiratory effort is not labored.  CARDIAC:  An S1 with an S2.  There are no clicks or significant murmurs.  ABDOMEN:  Soft.  EXTREMITIES:  He has no peripheral edema.   No labs were done today.   PROBLEMS INCLUDE:  1. Coronary artery disease, post coronary artery bypass graft in the      1985 range.  He is feeling a little better on a small dose of      metoprolol and this will be continued.  We will consider an      adenosine Myoview scan at some point to see if there has been a      major change, but not at this time.  2.  Minimal carotid disease.  3. Mild bradycardia.  This is not made worse with his low-dose beta-      blocker.  4. Right inguinal hernia repair in [place] in the past years.  5. Leg pain.  We know that his films as reported that he brought back      from [place] showed T11 spondylosis, L1-L2 spondylosis, L3-L5 disk      disease, prominent at L5.  There was extensive costal cartilage      calcification.  There was also a bipolar hip replacement and lumbar      spine hypolordosis.  I do not know if further evaluation of his      spine would be helpful concerning his leg pain and I asked him  to      talk with Dr. Kriste Rice about this.  6. Significant diarrhea in the past year, which was treated and      resolved.  7. Old right bundle-branch block.  8. Status post hip replacement.   Cardiac status is stable.  I have decided not to do any other testing.  We will leave him on a low dose of beta-blockade.  Then, I will see him  for followup.     Randy Abed, MD, [facility]  Electronically Signed    JDK/MedQ  DD: 12/06/2007  DT: 12/07/2007  Job #: [id number]   cc:   [name]. Randy Basque, MD

## 2010-05-20 NOTE — Assessment & Plan Note (Signed)
[facility]                            CARDIOLOGY OFFICE NOTE   NAME:WECHSLERJohntavius, Shepard                     MRN:          [id number]  DATE:04/27/2007                            DOB:          Mar 23, 1916    Mr. Cwik is actually doing quite well.  He recently was visiting in  [place] and noted a bulge in his right inguinal area and in fact he had  a significant acute right inguinal hernia.  He underwent surgery and  went home the next day and flew home 3 days later and he has done well.  He had no chest pain and no significant problems.  He has known coronary  disease.  He is doing very well.   PAST MEDICAL HISTORY:   ALLERGIES:  He has felt poorly with NITROGLYCERIN.   MEDICATIONS:  Aspirin, multivitamin, Clarinex, Lasix, fish oil, Imdur,  glucosamine, coenzyme Q 10, MiraLax, Lipitor, lutein, calcium and  magnesium and vitamin D.   OTHER MEDICAL PROBLEMS:  See the list below.   REVIEW OF SYSTEMS:  He really feels fine and his review of systems is  negative.   PHYSICAL EXAMINATION:  VITAL SIGNS:  Weight is 132 pounds.  Blood  pressure is 135/76 with a pulse of 74.  GENERAL APPEARANCE:  The patient is oriented to person, time and place.  Affect is normal.  HEENT:  No xanthelasma.  He has normal extraocular motion.  NECK:  There are no carotid bruits.  There is no jugular venous  distention.  LUNGS:  Clear.  Respiratory effort is not labored.  CARDIAC:  Exam reveals an S1 with an S2.  There are no clicks or  significant murmurs.  ABDOMEN:  Soft.  He has no significant peripheral edema.   LABORATORY DATA:  The patient's EKG reveals his old  right bundle branch  block and old inferior MI.   PROBLEMS:  1. History of coronary artery bypass grafting 24 years ago at [facility].  2. Minimal carotid disease by Doppler in February 2008.  3. No evidence of abdominal aortic aneurysm by study in February 2005.  4. Myoview was 2007 with question of mild  ischemia at the base of the      anterolateral wall, but no change and he is followed.  5. Mild bradycardia and therefore no beta blockers.  6. Old right bundle branch block.  Pullback to the labs.  7. Recent need for urgent right inguinal hernia in [place] and this      was done successfully and he is doing well.   No change in his meds.  I will see him in 6 months.     Luis Abed, MD, [facility]  Electronically Signed    JDK/MedQ  DD: 04/27/2007  DT: 04/27/2007  Job #: [id number]   cc:   Lonzo Cloud. Kriste Basque, MD

## 2010-05-22 ENCOUNTER — Other Ambulatory Visit: Payer: Self-pay | Admitting: Cardiology

## 2010-05-23 ENCOUNTER — Other Ambulatory Visit: Payer: Self-pay | Admitting: *Deleted

## 2010-05-23 MED ORDER — ISOSORBIDE MONONITRATE ER 30 MG PO TB24: 30.0000 mg | ORAL_TABLET | Freq: Every day | ORAL | Status: DC

## 2010-05-23 NOTE — Op Note (Signed)
NAMETAOS, TAPP                        ACCOUNT NO.:  [id number]   MEDICAL RECORD NO.:  [id number]                   PATIENT TYPE:  INP   LOCATION:  5030                                 FACILITY:  [facility]   PHYSICIAN:  Loreta Ave, M.D.              DATE OF BIRTH:  08/14/1916   DATE OF PROCEDURE:  04/24/2002  DATE OF DISCHARGE:                                 OPERATIVE REPORT   PREOPERATIVE DIAGNOSIS:  End-stage degenerative arthritis of the left hip.   POSTOPERATIVE DIAGNOSIS:  End-stage degenerative arthritis of the left hip.   OPERATION/PROCEDURE:  Left total hip replacement utilizing Osteonix  prosthesis.  A 56 mm Trident metallic acetabular component screw fixation x2  with 10 degree 36 mm internal diameter polyethelene insert.  Cemented HFX  femoral stem size #7.  A 127 degree neck angle.  A 36 mm metallic head plus  0.   SURGEON:  Loreta Ave, M.D.   ASSISTANT:  Arlys John D. Petrarca, P.A.-C.   ANESTHESIA:  General.   ESTIMATED BLOOD LOSS:  Minimal.   SPECIMENS EXCISED:  Bone and soft tissue.   CULTURES:  None.   COMPLICATIONS:  None.   DRESSING:  Soft compressive with adduction pillow.   DESCRIPTION OF PROCEDURE:  The patient was brought to the operating room and  after anesthesia had been obtained, the patient was turned to a lateral  position with left side up, prepped and draped in the usual sterile fashion.  A curved incision along the shaft of the femur extending posterior and  superior.  The skin and subcutaneous tissues were divided.  The iliotibial  band was incised.  A Charnley retractor was put in place.  The neurovascular  structures were identified and protected throughout.  The external rotator  and capsule were taken down off the back of the intertrochanteric groove on  the femur and tied with Fibrewire suture.  The hip was dislocated.  Grade IV  changes.  Femoral neck cut one fingerbreadth above the lesser trochanter in  line with the  definitive component.  Acetabulum exposed.  Sequential reaming  up to good bleeding bone and appropriate placement, sized to a 56 mm  component which was hammered in place, placing it in 45 degrees of abduction  and 20 degrees of anteversion.  Fixation augmented with two screws through  the cup at 20 mm above and a 60 mm in the back.  A 36 mm internal diameter  polyethelene 10 degree insert was then placed with the overhang placed  posterior superior.  Attention was turned to the femur.  Sequential reaming  with the hand-held and power reamers up to a cemented #7 component.  Cement  restrictor was placed 2 cm distal to the component.  After appropriate  trials, choosing a #7 component with 127 degree neck angle and a 0 head, the  definitive component was cemented under pressurized technique into the  femur,  restoring normal femoral anteversion.  The plus 0 head was attached.  This allowed excellent restoration of the leg lengths to a good stability in  flexion and extension.  The wound was irrigated.  External rotator and  capsule were repaired to the back of the intertrochanteric groove through  drill holes and sutures tied over a bony bridge, reconstructing the  posterior wall and external rotators.  Charnley retractor removed.  The  iliotibial band was closed with #1 Vicryl, the skin and subcutaneous tissues  with Vicryl and staples.  The margins of the wounds were injected with  Marcaine.  A sterile compressive dressing was applied.  Returned to the  supine position.  The abduction pillow was placed.  Anesthesia was reversed.  Brought to the recovery room.  Tolerated the surgery well with no  complications.                                                Loreta Ave, M.D.    DFM/MEDQ  D:  04/24/2002  T:  04/24/2002  Job:  [id number]

## 2010-05-23 NOTE — Op Note (Signed)
[facility]  Patient:    [name], [name]                     MRN: [id number] Proc. Date: 02/20/99 Adm. Date:  [id number] Attending:  Gennie Alma CC:         Cecil Cranker, M.D. [facility]             Scott M. Kriste Basque, M.D. [facility]                           Operative Report  CCS# [id number]  PREOPERATIVE DIAGNOSIS:  Right inguinal hernia.  POSTOPERATIVE DIAGNOSIS:  Direct right inguinal hernia.  OPERATION PERFORMED:  Repair of right inguinal hernia with mesh.  SURGEON:  Milus Mallick, M.D.  ANESTHESIA:  Local infiltration with 1% Xylocaine--30 cc and monitored anesthesia care.  DESCRIPTION OF PROCEDURE:  Under adequate perioperative intravenous sedation, the patients right inguinal region was prepared and draped in the usual fashion. An oblique incision was made overlying the right inguinal canal and carried down through Scarpas and Campers fascia.  Bleeders were electrocoagulated.  Two large veins were divided over hemostats which were ligated with 4-0 Vicryl.  The external oblique aponeurosis was exposed and opened in the direction of its fibers down through the external inguinal ring.  The ilioinguinal nerve was placed in a position of protection.  There was a moderate sized hernia that was coming through the floor of the inguinal canal through Hesselbachs triangle and was a significant direct hernia with a tight ring.  The spermatic cord was dissected up and encircled with a Penrose drain.  It was explored and there was no evidence of an indirect hernia sac. The defect in the floor of the inguinal canal was very discrete and fairly tight. he hernia was reduced, however, after dividing all of the adhesions to it.  The defect in the floor of the inguinal canal was closed with interrupted sutures of 2-0 Surgilon.  Next, a repair was carried out using Atrium mesh that was cut to appropriate size and placed as an onlay patch  overlying the closure of the defect in the floor of the inguinal canal.  The onlay patch was sutured to the inguinal ligament, the pubic tubercle and the conjoined tendon with interrupted sutures f 2-0 Surgilon.  The mesh was slit laterally in order to accomodate the spermatic  cord and the ilioinguinal nerve.  The tails of the mesh were crossed lateral to  these structures thereby recreating a new internal inguinal ring.  The tails of the mesh were fixed to the inguinal ligament lateral to the internal ring with 0 Surgilon.  Hemostasis was ascertained.  The external oblique aponeurosis was closed with a  continuous suture of 3-0 Vicryl.  The newly constructed external inguinal ring admitted the tip of the index finger.  Scarpas fascia was closed with continuous suture of 4-0 Vicryl.  The subcuticular layer was reapproximated with a continuous sutures of 5-0 Vicryl.  Half-inch Steri-Strips were applied to the skin and sterile dressing was applied.  Estimated blood loss for this procedure was negligible. The patient tolerated the procedure well and left the operating room in satisfactory condition. DD:  02/20/99 TD:  02/20/99 Job: [id number]

## 2010-05-23 NOTE — Assessment & Plan Note (Signed)
[name]                              CARDIOLOGY OFFICE NOTE   NAME:[name], [name]                     MRN:          [id number]  DATE:11/10/2005                            DOB:          [birth year]    A very pleasant 77 year old married white male with a long history of  coronary artery disease, 23 years post CBG by Dr. Izell [place] Rice at [facility].  Patient's  cardiac status is quite stable.  His lipids have been well controlled,  although his Lipitor was discontinued recently because of a question of  relationship to leg pain.  It has not made any difference.  His recent LDL  was up to 117.   Patient's primary problem is that of leg pain.  He has seen Dr. Sandria Rice over  the years concerning this.  Dr. Sandria Rice suggested some steroid injections, but  the patient would like to avoid.   He is on aspirin 81, multivitamin, Clarinex, Lasix 20, Imdur 15 b.i.d., CoQ  Enzyme 10, MiraLax.   PHYSICAL EXAMINATION:  VITAL SIGNS:  Blood pressure 168/72, pulse 57, normal  sinus rhythm.  GENERAL:  The patient is normal.  NECK:  JVP is not elevated.  Carotid pulses are without bruits.  LUNGS:  Clear.  CARDIAC:  Unremarkable.  EXTREMITIES:  No edema.   IMPRESSION:  As above.  His blood pressure is significantly elevated today.   I have suggested Norvasc 5 mg daily and will also restart his Lipitor 10.  I  will plan to see him back in 4-6 weeks concerning his blood pressure.  Mr.  [name] has asked for referral to a rheumatologist, and I will get him an  appointment with Dr. Corliss Rice.   We repeated the blood pressure 138/72.  I will not give him Norvasc at this  time.       ______________________________  E. Graceann Congress, MD, [facility]   Job# [id number]  [name]/MedQ  DD: 11/10/2005  DT: 11/11/2005  Job #: [id number]

## 2010-05-23 NOTE — Assessment & Plan Note (Signed)
[facility]                            CARDIOLOGY OFFICE NOTE   NAME:WECHSLERArrington, Yohe                     MRN:          [id number]  DATE:12/08/2005                            DOB:          1916-02-06    Tuesday, December 08, 2005   Mr. Bocek is a very pleasant 77 year old white married male with a  long history of coronary artery disease, 23 years post CABG by Dr.  Letha Cape at [facility].   Patient has been quite stable but over the past year has noted some  increased frequency of exertional dyspnea and chest pressure.  When he  was seen last month, his blood pressure was elevated 160/72. We added  Norvasc 5 mg daily and restarted his Lipitor 10.  The patient has been  feeling well.  The most recent stress test  July 31, 2003, revealed  moderate anterolateral lateral wall ischemia more prominent than the  study of March 2004.  EF was 57%.   OTHER MEDICATIONS:  1. Aspirin 81.  2. Lasix 20.  3. Imdur 15 b.i.d.  4. Co-Q enzyme 10.  5. MiraLax.  6. Lutein.   PHYSICAL EXAMINATION:  GENERAL APPEARANCE:  Normal.  VITAL SIGNS:  Blood pressure 116/58, pulse 61 normal sinus rhythm.  NECK:  JVP is not elevated.  Carotid pulses palpable and equal.  Short  bruit at the left.  LUNGS:  Clear.  CARDIOVASCULAR:  Normal.  ABDOMEN:  Normal.  EXTREMITIES:  No edema and pulses are palpable and equal bilaterally.   IMPRESSION:  Diagnoses as above.  Coronary artery disease, 23 years post  coronary artery bypass grafting, recent increase in symptoms though  stable.  I have suggested an adenosine Myoview, also will plan to follow  carotid Dopplers as these were done February 2007, revealing bilateral  moderate disease 40-59% right internal carotid artery and 0-39 left.  We  plan lipid profile and basic metabolic panel at that time.   I should note the EKG reveals normal sinus rhythm, old inferior lateral  myocardial infarction, right bundle branch block.     Cecil Cranker, MD, [facility]  Electronically Signed   EJL/MedQ  DD: 12/08/2005  DT: 12/09/2005  Job #: [id number]

## 2010-05-23 NOTE — Discharge Summary (Signed)
[name], [name]                        ACCOUNT NO.:  [id number]   MEDICAL RECORD NO.:  [id number]                   PATIENT TYPE:  INP   LOCATION:  5030                                 FACILITY:  [facility]   PHYSICIAN:  Loreta Ave, M.D.              DATE OF BIRTH:  06-02-1916   DATE OF ADMISSION:  04/24/2002  DATE OF DISCHARGE:  04/28/2002                                 DISCHARGE SUMMARY   ADMISSION DIAGNOSIS:  Advanced degenerative joint disease of left hip.   DISCHARGE DIAGNOSES:  1. Advanced degenerative joint disease of left hip.  2. Coronary artery bypass grafting.  3. Hyperlipidemia.  4. Reflux.  5. Hypertension.   PROCEDURE:  Left total hip replacement.   HISTORY:  An 77 year old white male who has had persistent hip and groin  pain which has failed with conservative treatment.  He has had pain with  every step and has gotten to the point where he is having difficulties with  his activities of daily living.  Since he has failed with conservative  treatment, he is now indicated for left total hip replacement.   HOSPITAL COURSE:  An 77 year old white male admitted April 24, 2002 after  appropriate laboratory studies were obtained, as well as 1 g of Ancef IV on-  call to the operating room.  He was taken to the operating room, where he  underwent a left total hip replacement.  He tolerated the procedure well.  He was continued on Ancef 1 g IV q.8h. x3 doses postoperatively.  Heparin  5000 units subcu q.12h. was started until his Coumadin became therapeutic.  Consultation with PT, OT, and rehab were made.  He was allowed to ambulate  in physical therapy weightbearing as tolerated on the left with a walker.  He was allowed out of bed to a chair the following day.  He was allowed to  begin physical therapy and ambulate in his room initially and then in the  hall.  He did have some difficulty with urinary retention and had to be  straight catheterized after his  Foley was discontinued on postoperative day  #2.  This was only for 24 hours.  He had resolution of his symptoms.  He did  develop hyponatremia; however, with repeating of his electrolytes it did not  appear to have marked change from preoperatively.  He otherwise had an  unremarkable hospital course and was discharged on April 23 in improved  condition.  We did give him a 1000 fluid restriction for 24 hours.   LABORATORY DATA:  Admitted with a hemoglobin of 14.5, hematocrit 42.5%,  white count 8100, platelets 211,000.  Preoperative sodium 131, potassium  4.5, chloride 100, CO2 26, glucose 107, BUN 13, creatinine 0.9, calcium 8.9,  total protein 5.7, albumin 3.6, AST 30, ALT 33, ALP 55, total bilirubin 0.7.  Discharge electrolytes reveals sodium 130, potassium 4.4, chloride 96, CO2  28, BUN 9,  creatinine 0.8, glucose 97.  Urinalysis was benign for any voided  urine on admission.  On April 27, 2002, he had a trace amount of hemoglobin,  0-2 white, 0-2 red, and rare bacteria.  He was blood type O+, antibody  screen negative.   No EKG was on the chart at this time.   His studies of April 24, 2002 reveals a left hip in good position and  alignment following left total hip replacement.    DISCHARGE MEDICATIONS:  1. He was given a prescription for Percocet 5/325 one to two tablets q.4h.     p.r.n. pain.  He will use one tablet mainly; however, if he has severe     pain he may use two.  2. Coumadin 4 mg as directed by the pharmacist.  3. Colace 100 mg b.i.d.  4. Magnesium sulfate 325 mg b.i.d.   ACTIVITY:  He will be allowed to ambulate as taught in physical therapy.   DIET:  No diet restrictions at this time.   WOUND CARE:  Keep the wound clean and dry and may clean the wound daily with  alcohol and place some Betadine to the wound and then cover with a sterile  dressing.  To follow the blue sheet of instructions.   FOLLOW UP:  Follow back up in the office in 10-14 days for  followup.   CONDITION ON DISCHARGE:  He was discharged in good condition.     Oris Drone Petrarca, P.A.-C.                Loreta Ave, M.D.    BDP/MEDQ  D:  04/28/2002  T:  04/28/2002  Job:  [id number]

## 2010-05-23 NOTE — Discharge Summary (Signed)
NAMESERGEY, ISHLER                        ACCOUNT NO.:  [id number]   MEDICAL RECORD NO.:  [id number]                   PATIENT TYPE:  INP   LOCATION:  3741                                 FACILITY:  [facility]   PHYSICIAN:  Veneda Melter, M.D.                   DATE OF BIRTH:  Nov 14, 1916   DATE OF ADMISSION:  05/01/2002  DATE OF DISCHARGE:  05/11/2002                                 DISCHARGE SUMMARY   PROCEDURES:  1. Lower extremity Dopplers.  2. Abdominal ultrasound.  3. CT of the abdomen and pelvis.  4. CT of the chest.  5. 2-D echocardiogram.   HOSPITAL COURSE:  The patient is an 77 year old male with known coronary  artery disease.  He had bypass surgery in 1985 and has not had cardiac  problems since.  He had a hip replacement electively on April 24, 2002 and  was convalescing at [facility].  On the day of admission he underwent  physical therapy and then began to feel weak and presyncopal.  He was noted  to have hypotensive and transferred to [facility].  A systolic blood  pressure was in the 80s and he had dopamine initiated.  There was a report  of some chest discomfort so he was urgently transferred to [facility] for further evaluation and treatment.   An orthopedic consult was called to manage his hip.  There was no erythema  and his ecchymosis was resolving.  There was no particular pain of palpation  and there was no reflective pain.  The extremity was cool and had no signs  of infection.  Because of the hypotension an evaluation of his chest and  abdomen was done with CT.  The CT scan of lower extremities showed no  evidence for DVT and a 1.9 cm calcified bladder stem was seen.  There was  edematous change in the left lower extremity.  There was no evidence of  pulmonary embolic disease or aortic dissection.  There was diffuse pleural  calcification compatible with previous asbestos exposure.  There was diffuse  dilatation of the esophagus with fluid  __________ in the distal esophageal  lesion such as a stricture or tumor, even severe reflux was to be  considered.  A GI consult was called.   The patient was anemic, although he was heme-negative.  It was felt that  this was largely secondary to operative loss.  He had a dilated esophagus  and it was felt that an EGD was indicated.  Additionally, he had some  problems with constipation and a laxative p.r.n. was indicated as well.  The  EGD showed candidal esophagitis and his esophagus was dilated.  Fluconazole  was ordered and it was felt that he would need a 14-day course.  It was also  noted that this could potentiate the effects of Coumadin but it was  considered okay to restart anticoagulation.  An ID consult was called because of fevers and leukocytosis that were not  completely explained by the candidal esophagitis.  It was felt that his  leukocytosis could be benign secondary to urinary retention or constipation  or impaction.  ID continued to follow the patient and he was on Ancef and  Cipro.  CT of the abdomen and pelvis was obtained as well.  It showed  diffuse subcutaneous and mesenteric edema with gallstones and a dilated  gallbladder.  There were small bilateral pleural effusions and probable  esophageal dysmotility.  There was minimal ectasia of the abdominal aorta  without overt aneurysm.  Thickening of the colon was also seen with  potential inflammation in the region of the cecum and they could not exclude  appendicitis or a paracecal abscess.  There was diffuse mesenteric and  subcutaneous stranding.  Diverticulosis was seen and a probable fecal  impaction.   Lower extremity Dopplers were performed to rule out DVT, Baker's cyst and  these were negative.  The films were reviewed by Dr. Marina Goodell and Dr. Arlyce Dice  and it was felt that he had no active intra-abdominal process.  The patient  continued to be followed by infectious disease and the Zosyn was  discontinued;  however, the Diflucan was continued for a total of two weeks.  The patient began having loose stools approximately May 09, 2002.  He had  Lactinex added to his medication regimen and stool was checked for C.  difficile which was negative.  As his white count normalized his other  antibiotics were discontinued.  He continued on DVT Lovenox with Coumadin  loading.  He was in skilled facility at [facility] and arrangements  were made with case management to send him back to this facility for further  rehabilitation.   By May 11, 2002 his INR was 1.7 and his hemoglobin was 11.3 with WBC within  normal limits.  Additionally, he had a BMET checked which showed a normal  BUN and creatinine.  Potassium within normal limits.  The patient was  considered stable for discharge on May 11, 2002 with close outpatient follow-  up.   The patient is being sent home on his previous Coumadin dosage of 4 mg p.o.  daily.  This is followed at [facility] by the [facility] physician.  Coumadin level is being ordered daily until his INR is greater than 2.0,  then each week.  The on-site physician will manage this.   LABORATORIES:  Hemoglobin 11.3, hematocrit 33.3, WBC 5.4, platelets 448,000.  INR at discharge 1.7.  C. difficile toxin negative.  Fecal occult blood  positive.  TSH 2.489.   Chest x-ray:  Low lung volumes with left lower lobe segmental atelectasis  and asymmetric density left mid lung attributed to overlap of vascular and  osseous structures.   CONDITION ON DISCHARGE:  Improved.   DISCHARGE DIAGNOSES:  1. Hypertension and presyncope, question sepsis.  2. Status post aortocoronary bypass surgery 1985.  3. Status post left total hip replacement on April 24, 2002.  4. Anticoagulation secondary to hip replacement.  5. Diarrhea secondary to antibiotics with stool negative for Clostridium     difficile improving on Lactinex.  6. Anemia. 7. Hyperlipidemia.  8. Reflux.  9. Benign prostatic  hypertrophy.  10.      History of hypertension with hypotension this admission.  11.      Status post hemorrhoid surgery.  12.      History of allergy to SULFA and  intolerance to NITROGLYCERIN     secondary to hypotension.  13.      History of constipation and fecal impaction this admission.  14.      Candidal esophagitis.  15.      Dysphagia.  16.      History of colon polyps.   DISCHARGE INSTRUCTIONS:  His activity level is to include PT and OT five  days a week.  He is to have an activity level as tolerated.  He is to stick  to a fat modified diet.  He is to follow up with E. Graceann Congress, M.D. on  May 11 at 11 a.m. at [address].  He is to see Dr. Kriste Basque as  needed and Dr. Eulah Pont as needed.   DISCHARGE MEDICATIONS:  1. Zocor 10 mg p.o. daily.  2. Protonix 40 mg p.o. daily.  3. Coated aspirin 81 mg p.o. daily.  4. Diflucan 100 mg p.o. daily through May 20, 2002.  5. Lactinex two p.o. b.i.d. for 14 days.  6. Iron 325 mg p.o. daily.  7. Colace p.r.n.  8. Senokot p.r.n.  9. Robaxin p.r.n.  10.      Percocet p.r.n.  11.      Coumadin 4 mg p.o. daily.  12.      He is not to take Altace, Norvasc, or metoprolol for now.  These     medications can be restarted as an outpatient.     Lavella Hammock, P.A. [facility]                  Veneda Melter, M.D.    RG/MEDQ  D:  05/11/2002  T:  05/11/2002  Job:  [id number]   cc:   Lonzo Cloud. Kriste Basque, M.D. [facility]   Bea Laura Graceann Congress, M.D.   Loreta Ave, M.D.  [address]  [place] [address]  Fax: [phone]   Lacretia Leigh. Ninetta Lights, M.D.  [address]  [place]  [place] [address]  Fax: [phone]   Wilhemina Bonito. Marina Goodell, M.D. [facility]

## 2010-05-23 NOTE — Assessment & Plan Note (Signed)
[facility]                            CARDIOLOGY OFFICE NOTE   NAME:WECHSLERKourtney, [name]                     MRN:          [id number]  DATE:04/29/2006                            DOB:          12-28-1916    Mr. Knisley is a very pleasant 77 year old white married male with a  long history of coronary artery disease 23 years post coronary artery  bypass graft surgery by Dr. Letha Cape at [facility].   The patient had been quite stable with no recent chest pain or shortness  of breath.  No dizziness or presyncope.   He has known left anterior hemiblock and right bundle-branch block.   His beta blockers were discontinued in 1999 because of bradyarrhythmia.  Presently he is on:  1. Aspirin 81.  2. Clarinex 5.  3. Lasix 20.  4. Fish oil.  5. Imdur 15 b.i.d.  6. Glucosamine.  7. CoQ10.  8. MiraLax.  9. Lipitor.  10.Lutein.   His EKG reveals normal sinus rhythm at 65, left anterior hemiblock and  right bundle-branch block, possible old inferior MI.   Most recent carotids in February revealed stable bilateral stenosis at 0-  39%.  Abdominal ultrasound revealed no evidence of aneurysm in February  2005.  His most recent stress Myoview December 23, 2005, revealed no  significant change from February 2005.  There is  mild ischemia at the  base and anterolateral wall.   PHYSICAL EXAMINATION:  VITAL SIGNS:  Blood pressure 120/64, pulse 65,  normal sinus rhythm.  GENERAL APPEARANCE:  Normal.  NECK:  JVP is not elevated.  Carotid pulses palpable and equal without  bruits.  LUNGS:  Clear.  CARDIAC:  Reveals no murmur or gallop.  ABDOMEN:  Unremarkable.  EXTREMITIES:  No edema.   IMPRESSION:  Diagnoses as above.  The patient is quite stable 23 years  post coronary artery bypass grafting.  Because of his bifascicular block  and history of bradycardia, we will continue to leave him off the beta  blocker.   I have suggested a BMP today and will have him see  Dr. Jerral Bonito for  followup in 3 months or p.r.n.  The patient has seen Dr. Myrtis Ser in the  past.     E. Graceann Congress, MD, [facility]  Electronically Signed    EJL/MedQ  DD: 04/29/2006  DT: 04/29/2006  Job #: [id number]

## 2010-05-23 NOTE — Assessment & Plan Note (Signed)
[facility]                              CARDIOLOGY OFFICE NOTE   NAME:[name], Randy                     MRN:          [id number]  DATE:11/10/2005                            DOB:          May 31, 1916    We repeated the blood pressure 138/72.  I will not give him Norvasc at this  time.    ______________________________  E. Graceann Congress, MD, [facility]    EJL/MedQ  DD: 11/10/2005  DT: 11/11/2005  Job #: [id number]

## 2010-05-23 NOTE — H&P (Signed)
[name], [name]                        ACCOUNT NO.:  [id number]   MEDICAL RECORD NO.:  [id number]                   PATIENT TYPE:  INP   LOCATION:  2929                                 FACILITY:  [facility]   PHYSICIAN:  Salvadore Farber, M.D.             DATE OF BIRTH:  10-Jan-1916   DATE OF ADMISSION:  05/01/2002  DATE OF DISCHARGE:                                HISTORY & PHYSICAL   CARDIOLOGIST:  Cecil Cranker, M.D.   PRIMARY PHYSICIAN:  Lonzo Cloud. Kriste Basque, M.D. [facility]   CHIEF COMPLAINT:  Hypotension, presyncope.   HISTORY OF PRESENT ILLNESS:  An 77 year old gentleman with coronary artery  disease, status post coronary artery bypass grafting in 1980s with  subsequently benign cardiac history.  He underwent left hip replacement on  an elective basis on April 24, 2002.  Hospitalization was uncomplicated and  he has been convalescing at [facility] and progressing well, according to  himself.  This morning he underwent physical therapy.  Thereafter, he began  to feel weak and presyncopal.  He was found to be hypotensive and  transferred to [facility] Emergency Room.  On presentation there, he had a  heart rate of 83 and blood pressure of 84/19.  He was initiated on dopamine  and cardiology was consulted based on a report of chest discomfort.  The  patient was urgently transferred to [facility]U for further evaluation by  cardiology.  He was seen immediately upon arrival.   On arrival, the patient had blood pressure 120/49 with heart rate 100 on  dopamine.  He specifically denies any chest discomfort, dyspnea, abdominal  discomfort, increased hip discomfort, leg swelling, fever, chills, and  dysuria.  He simply states that he feels weak.   PAST MEDICAL HISTORY:  1. Coronary artery disease, status post coronary artery bypass graft in     1985.  2. Degenerative joint disease, status post left total hip replacement April 24, 2002.  3. Hypertension.  4.  Gastroesophageal reflux disease.  5. Dyslipidemia.  6. Benign prostatic hypertrophy, status post transurethral resection of     prostate.  7. Status post right inguinal hernia repair.   ALLERGIES:  1. SULFA.  2. NITROGLYCERIN.   MEDICATIONS PRIOR TO ADMISSION:  1. Zocor.  2. Norvasc 5 mg per day.  3. Senokot.  4. Robaxin.  5. Percocet.  6. Altace 10 mg per day.  7. Iron.  8. Colace.  9. Coumadin.  10.      Protonix.  11.      Metoprolol 25 mg twice per day.   SOCIAL HISTORY:  The patient lives alone in [place].  He is retired from  [employer].  He is single with several children living out of state.  Denies tobacco and alcohol use.  He was quite active prior to his recent  surgery.   FAMILY HISTORY:  Mother died at 52 of pneumonia.  Father died at 53 of  unknown causes.   REVIEW OF SYSTEMS:  Negative in detail except as above.   PHYSICAL EXAMINATION:  GENERAL:  This is a fatigued-appearing elderly man in  no acute distress.  VITAL SIGNS:  Heart rate 105, blood pressure 120/49, respiratory rate 22,  oxygen saturation of 100% on 80% nonrebreather.  Temperature 96.7.  NECK:  Jugular venous pressure is less than 5 cm.  LUNGS:  Clear to auscultation and percussion bilaterally.  CARDIAC:  He has a nondisplaced point of maximal cardiac impulse.  There is  a regular rate and rhythm without murmur.  There is an S4 but no S3.  There  is no rub.  ABDOMEN:  Soft, nondistended, nontender.  There is no hepatosplenomegaly.  Bowel sounds are normal.  EXTREMITIES:  Cool without clubbing or cyanosis.  There is trace edema in  the left lower extremity, which the patient states is chronic.  The right  hip is only minimally tender with both palpation and motion.  There is an  ecchymosis on the posterior thigh.   LABORATORY DATA:  Chest x-ray:  Chest CT was negative for pulmonary embolus  and infiltrate.  COPD was demonstrated.   Laboratory studies remarkable for sodium 127,  potassium 5, creatinine 1.1,  glucose 125, bicarbonate 24.  WBC 23,000 increased from 7000 on April 23;  hematocrit is 33, increased from 28 on April 23.  Platelets 417.  INR 2.6.  CK 180, CK-MB 6.6, troponin-I 0.75.  Urinalysis is currently pending.   Electrocardiogram demonstrates sinus tachycardia, right bundle branch block,  inferior myocardial infarction of indeterminate age, occasional PVC.   IMPRESSION AND RECOMMENDATIONS:  An 77 year old gentleman presents with  hypotension seven days status post elective total hip replacement.  He has  no symptoms suggestive of infection or myocardial ischemia.  Blood pressure  is currently supported with dopamine.  Leukocytosis raises concern for acute  infection.  Broad-spectrum antibiotics will be initiated including coverage  for prosthetic joint infection.  Orthopedics has been consulted and is  currently seeing the patient.  Multiple blood cultures, as well as cultures  of urine, have been sent and are pending.   The patient has troponin elevation in the setting of this illness.  With the  absence of ST deviations on electrocardiogram and the absence of chest pain,  I think a primary acute coronary syndrome is unlikely.  Rather, I feel it  more likely that he is having low-grade myocardial ischemia due to his  decreased perfusion in the setting of his hypotension.  We will initiate  aspirin.  No heparin, given his therapeutic INR.  We will cycle cardiac  enzymes.                                               Salvadore Farber, M.D.    WED/MEDQ  D:  05/01/2002  T:  05/02/2002  Job:  [id number]   cc:   Cecil Cranker, M.D.   Lonzo Cloud. Kriste Basque, M.D. [facility]   Loreta Ave, M.D.  [address]  [place] [address]  Fax: [phone]

## 2010-07-15 ENCOUNTER — Other Ambulatory Visit: Payer: Self-pay | Admitting: Cardiology

## 2010-08-10 ENCOUNTER — Other Ambulatory Visit: Payer: Self-pay | Admitting: Pulmonary Disease

## 2010-08-19 ENCOUNTER — Other Ambulatory Visit: Payer: Self-pay | Admitting: Pulmonary Disease

## 2010-08-19 ENCOUNTER — Ambulatory Visit (INDEPENDENT_AMBULATORY_CARE_PROVIDER_SITE_OTHER): Payer: Medicare Other | Admitting: Pulmonary Disease

## 2010-08-19 ENCOUNTER — Encounter: Payer: Self-pay | Admitting: Pulmonary Disease

## 2010-08-19 DIAGNOSIS — Z77090 Contact with and (suspected) exposure to asbestos: Secondary | ICD-10-CM

## 2010-08-19 DIAGNOSIS — J309 Allergic rhinitis, unspecified: Secondary | ICD-10-CM

## 2010-08-19 DIAGNOSIS — M1990 Unspecified osteoarthritis, unspecified site: Secondary | ICD-10-CM

## 2010-08-19 DIAGNOSIS — K219 Gastro-esophageal reflux disease without esophagitis: Secondary | ICD-10-CM

## 2010-08-19 DIAGNOSIS — Z87898 Personal history of other specified conditions: Secondary | ICD-10-CM

## 2010-08-19 DIAGNOSIS — I6529 Occlusion and stenosis of unspecified carotid artery: Secondary | ICD-10-CM

## 2010-08-19 DIAGNOSIS — I2510 Atherosclerotic heart disease of native coronary artery without angina pectoris: Secondary | ICD-10-CM

## 2010-08-19 DIAGNOSIS — K5730 Diverticulosis of large intestine without perforation or abscess without bleeding: Secondary | ICD-10-CM

## 2010-08-19 DIAGNOSIS — E785 Hyperlipidemia, unspecified: Secondary | ICD-10-CM

## 2010-08-19 DIAGNOSIS — I4510 Unspecified right bundle-branch block: Secondary | ICD-10-CM

## 2010-08-19 DIAGNOSIS — R1314 Dysphagia, pharyngoesophageal phase: Secondary | ICD-10-CM

## 2010-08-19 DIAGNOSIS — M545 Low back pain: Secondary | ICD-10-CM

## 2010-08-19 DIAGNOSIS — F411 Generalized anxiety disorder: Secondary | ICD-10-CM

## 2010-08-19 MED ORDER — OLOPATADINE HCL 0.6 % NA SOLN: NASAL | Status: DC

## 2010-08-19 NOTE — Patient Instructions (Addendum)
Today we updated your med list in EPIC...  We wrote a new prescription for PATANASE to spray 1-2 sprays in each nostril twice daily (before meals) as directed...  Stay as active as possible, and BE CAREFUL!!!  Call for any problems...  Let's plan a follow up visit w/ CXR & fasting labs in 6 months.Marland KitchenMarland Kitchen
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
## 2010-08-20 ENCOUNTER — Other Ambulatory Visit: Payer: Self-pay | Admitting: *Deleted

## 2010-08-20 MED ORDER — DESLORATADINE 5 MG PO TABS: 5.0000 mg | ORAL_TABLET | Freq: Every day | ORAL | Status: DC

## 2010-08-24 ENCOUNTER — Encounter: Payer: Self-pay | Admitting: Pulmonary Disease

## 2010-09-08 ENCOUNTER — Emergency Department (HOSPITAL_COMMUNITY)
Admission: EM | Admit: 2010-09-08 | Discharge: 2010-09-08 | Disposition: A | Discharge status: Home / Self Care | Payer: Medicare Other | Attending: Emergency Medicine | Admitting: Emergency Medicine

## 2010-09-08 ENCOUNTER — Emergency Department (HOSPITAL_COMMUNITY): Payer: Medicare Other

## 2010-09-08 DIAGNOSIS — M129 Arthropathy, unspecified: Secondary | ICD-10-CM | POA: Insufficient documentation

## 2010-09-08 DIAGNOSIS — K219 Gastro-esophageal reflux disease without esophagitis: Secondary | ICD-10-CM | POA: Insufficient documentation

## 2010-09-08 DIAGNOSIS — Z79899 Other long term (current) drug therapy: Secondary | ICD-10-CM | POA: Insufficient documentation

## 2010-09-08 DIAGNOSIS — I779 Disorder of arteries and arterioles, unspecified: Secondary | ICD-10-CM | POA: Insufficient documentation

## 2010-09-08 DIAGNOSIS — I10 Essential (primary) hypertension: Secondary | ICD-10-CM | POA: Insufficient documentation

## 2010-09-08 DIAGNOSIS — I2510 Atherosclerotic heart disease of native coronary artery without angina pectoris: Secondary | ICD-10-CM | POA: Insufficient documentation

## 2010-09-08 DIAGNOSIS — R079 Chest pain, unspecified: Secondary | ICD-10-CM

## 2010-09-08 LAB — POCT I-STAT TROPONIN I
Troponin i, poc: 0 ng/mL (ref 0.00–0.08)
Troponin i, poc: 0.01 ng/mL (ref 0.00–0.08)

## 2010-09-08 LAB — BASIC METABOLIC PANEL
BUN: 28 mg/dL — ABNORMAL HIGH (ref 6–23)
Chloride: 100 mEq/L (ref 96–112)
GFR calc Af Amer: >60 mL/min (ref >60)
Glucose, Bld: 79 mg/dL (ref 70–99)
Potassium: 4.3 mEq/L (ref 3.5–5.1)
Sodium: 134 mEq/L — ABNORMAL LOW (ref 135–145)

## 2010-09-08 LAB — DIFFERENTIAL
Lymphs Abs: 1.8 10*3/uL (ref 0.7–4.0)
Monocytes Relative: 16 % — ABNORMAL HIGH (ref 3–12)
Neutro Abs: 3.1 10*3/uL (ref 1.7–7.7)
Neutrophils Relative %: 51 % (ref 43–77)

## 2010-09-08 LAB — CBC
Hemoglobin: 13.7 g/dL (ref 13.0–17.0)
MCH: 32.6 pg (ref 26.0–34.0)
MCV: 92.1 fL (ref 78.0–100.0)
RBC: 4.2 MIL/uL — ABNORMAL LOW (ref 4.22–5.81)
WBC: 6.1 10*3/uL (ref 4.0–10.5)

## 2010-09-24 NOTE — Consult Note (Signed)
NAMEOAKES, MCCREADY            ACCOUNT NO.:  [id number]  MEDICAL RECORD NO.:  [id number]  LOCATION:  [facility]                         FACILITY:  [facility]  PHYSICIAN:  Bevelyn Buckles. Tahji , MDDATE OF BIRTH:  January 08, 1916  DATE OF CONSULTATION:  09/08/2010 DATE OF DISCHARGE:                                CONSULTATION   REQUESTING PHYSICIAN:  Dione Booze, MD in the emergency room.  PRIMARY CARDIOLOGIST:  Luis Abed, MD, [facility].  PRIMARY CARE PHYSICIAN:  Lonzo Cloud. Kriste Basque, MD  REASON FOR CONSULTATION:  Chest pain.  HISTORY OF PRESENT ILLNESS:  [name] is a delightful 77 year old male with a history of coronary artery disease status post bypass grafting in 1985 and also has history of right bundle branch block.  He has been followed very closely by Dr. Myrtis Ser.  He is currently living at the [facility] assisted living center.  He tells me at that baseline he does pretty well though he moves pretty slowly.  He tells me his occasional chest pain which is usually resolved with 2 baby aspirin.  He does not take nitroglycerin as he took it once in the past and had a syncopal episode.  He has been in his usual state of health.  This morning he woke up to go to the bathroom, he felt fine, on getting back into bed he developed some chest pressure.  There were no associated symptoms.  He got back up to two baby aspirin without much relief.  As the pain was not getting better, he decided to activate 911.  On the arrival, he was given nitroglycerin without much relief in his symptoms.  He came to the ER, by the time he got to the ER he was pain free.  He says the pain is not much different from that what he experienced before, so he is little bit unsure why he called 911 this time.  He is now very eager to go back home.  In the ER, he has had an EKG which shows sinus rhythm with an inferior Q- waves.  There is no acute ST-T wave abnormalities.  His first set of point-of-care markers  show a troponin of 0.  REVIEW OF SYSTEMS:  He does endorse some constipation which he says is chronic for him, he has not had bowel movement for several days.  He denies any fevers or chills.  No heart failure symptoms.  No nausea or vomiting.  He does have arthritis pain and walks slowly.  The remainder of review of systems is negative except for HPI and problem list.  PROBLEM: 1. Coronary artery disease, status post bypass surgery at [facility] in     1995. 2. Right bundle branch block. 3. Gastroesophageal reflux disease. 4. Mild carotid artery disease 0-39% bilaterally. 5. History of bradycardia. 6. Chronic constipation. 7. Echocardiogram in May 2011 showed an EF of 65% with mild mitral     regurgitation and mild aortic valve sclerosis. 8. Status post transurethral resection of prostate. 9. History of syncope with nitroglycerin.  CURRENT MEDICATIONS: 1. Aspirin 81 a day. 2. Lipitor 20 a day. 3. Calcium and magnesium and zinc. 4. Vitamin D3. 5. Co-enzyme Q10. 6. Clarinex. 7. Lasix  20 mg a day. 8. Glucosamine chondroitin. 9. Imdur 30 a day. 10.Metoprolol 25 a day. 11.Multivitamin. 12.Omega-3 fish oil. 13.MiraLax. 14.Vitamin B6.  SOCIAL HISTORY:  He is widowed.  He lives at the [facility].  He denies any tobacco or alcohol.  FAMILY HISTORY:  Noncontributory at this point.  PHYSICAL EXAMINATION:  GENERAL:  He is an elderly frail male in no acute distress.  Respirations are unlabored. VITAL SIGNS:  Blood pressure initially was 156/87, heart rate is 75, blood pressure is now 120/60 with a heart rate of 58.  He is satting 99% on room air. HEENT:  Normal. NECK:  Supple.  No obvious JVD.  Carotids are 2+ bilaterally with no bruits.  There is no lymphadenopathy or thyromegaly. CARDIAC:  He is bradycardic and regular with a mild systolic ejection murmur at the right sternal border. LUNGS:  Clear. ABDOMEN:  Soft, mildly distended, but nontender.  There is  hypoactive bowel sounds. EXTREMITIES:  Warm with no cyanosis, clubbing or edema.  No rash. NEURO:  Alert and oriented x3.  Cranial nerves II-XII are intact.  Moves all 4 extremities without difficulty.  Affect is pleasant.  EKG shows sinus rhythm with a right bundle branch block and an inferior Q-waves noted.  No acute ST-T wave changes.  Chest x-ray shows minimal bibasilar atelectasis, otherwise normal.  White count 6.1, hemoglobin 13.7, platelets of 133, troponin is 0.  Sodium 134, potassium 4.3, creatinine 0.97.  ASSESSMENT: 1. Chest pain. 2. History of coronary artery disease, status post previous bypass     surgery.  PLAN/DISCUSSION:  At this point, I am not completely sure what to make of Mr. Shipper symptoms.  He does appear to have occasional episodes of angina which are quite stable and pattern.  Currently, he is pain free and has no evidence of ischemia.  He is quite eager to go home. Given his advanced age I am not very eager to proceed with further aggressive workup unless he has debilitating symptoms.  I have told him that I would feel comfortable him going home if he was able to walk around the ER without any further chest pain and also if he had another set of cardiac markers which were normal.  He is agreed to this plan.  I have also discussed this with his friend.  He also agrees that if he has recurrent symptoms he will once again activate 911 quickly.  We will await his second set of cardiac markers and make sure he can ambulate and I suspect we can send him home a little later today.     Bevelyn Buckles. Hassan Blackshire, MD     DRB/MEDQ  D:  09/08/2010  T:  09/08/2010  Job:  [id number]  cc:   Luis Abed, MD, [facility] Lonzo Cloud. Kriste Basque, MD  Electronically Signed by Arvilla Meres MD on 09/24/2010 10:39:15 PM

## 2010-10-14 ENCOUNTER — Encounter: Payer: Self-pay | Admitting: Cardiology

## 2010-10-16 ENCOUNTER — Encounter: Payer: Self-pay | Admitting: Cardiology

## 2010-10-16 ENCOUNTER — Ambulatory Visit (INDEPENDENT_AMBULATORY_CARE_PROVIDER_SITE_OTHER): Payer: Medicare Other | Admitting: Cardiology

## 2010-10-16 DIAGNOSIS — R55 Syncope and collapse: Secondary | ICD-10-CM

## 2010-10-16 DIAGNOSIS — I358 Other nonrheumatic aortic valve disorders: Secondary | ICD-10-CM

## 2010-10-16 DIAGNOSIS — I359 Nonrheumatic aortic valve disorder, unspecified: Secondary | ICD-10-CM

## 2010-10-16 DIAGNOSIS — I2510 Atherosclerotic heart disease of native coronary artery without angina pectoris: Secondary | ICD-10-CM
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
## 2010-10-16 NOTE — Assessment & Plan Note (Signed)
He has had no further syncope or presyncope.  No further workup.

## 2010-10-16 NOTE — Patient Instructions (Signed)
Your physician recommends that you schedule a follow-up appointment in: 6 months, the office will mail you a reminder letter 2 months prior appointment date.

## 2010-10-16 NOTE — Assessment & Plan Note (Signed)
His valvular disease is mild.  No further workup at this time.  I'll see him back in 6 months.

## 2010-10-16 NOTE — Assessment & Plan Note (Signed)
Coronary disease is stable. No change in therapy. 

## 2010-10-28 ENCOUNTER — Other Ambulatory Visit: Payer: Self-pay | Admitting: *Deleted

## 2010-10-28 MED ORDER — METOPROLOL SUCCINATE ER 25 MG PO TB24: 25.0000 mg | ORAL_TABLET | Freq: Every day | ORAL | Status: DC

## 2011-02-12 ENCOUNTER — Other Ambulatory Visit: Payer: Self-pay | Admitting: Allergy

## 2011-02-12 MED ORDER — DESLORATADINE 5 MG PO TABS: 5.0000 mg | ORAL_TABLET | Freq: Every day | ORAL | Status: DC

## 2011-02-16 ENCOUNTER — Telehealth: Payer: Self-pay | Admitting: Pulmonary Disease

## 2011-02-16 DIAGNOSIS — I959 Hypotension, unspecified: Secondary | ICD-10-CM

## 2011-02-16 DIAGNOSIS — R972 Elevated prostate specific antigen [PSA]: Secondary | ICD-10-CM

## 2011-02-16 DIAGNOSIS — Z87898 Personal history of other specified conditions: Secondary | ICD-10-CM

## 2011-02-16 DIAGNOSIS — E785 Hyperlipidemia, unspecified: Secondary | ICD-10-CM

## 2011-02-16 DIAGNOSIS — D126 Benign neoplasm of colon, unspecified: Secondary | ICD-10-CM

## 2011-02-16 DIAGNOSIS — F411 Generalized anxiety disorder: Secondary | ICD-10-CM

## 2011-02-16 NOTE — Telephone Encounter (Signed)
ATC pt at home #.  Line rang multiple times with NA.  WCB

## 2011-02-16 NOTE — Telephone Encounter (Signed)
I spoke with pt and is requesting to have labs done 1 week prior to his 03/25/11 apt. Pt wants a complete lab work up. Please advise what albs pt needs to have done Dr. Kriste Basque, thanks

## 2011-02-17 NOTE — Telephone Encounter (Signed)
Orders for labs in the computer per SN for pt to have completed prior to his ov in march with SN.   lmom for pt to make him aware.

## 2011-02-18 ENCOUNTER — Telehealth: Payer: Self-pay | Admitting: Pulmonary Disease

## 2011-02-18 NOTE — Telephone Encounter (Signed)
I spoke with pt and advised him of SN recs. He voiced his understanding and states "I just had a BM". I advised him if he finds he becomes constipated again then to follow SN recs. Nothing further was needed

## 2011-02-18 NOTE — Telephone Encounter (Signed)
Per SN---for quick relief  1.  1 bottle of mag citrate followed by lots of water 2. Then if no results by tonight, take 2 dulcolax tablets at bedtime. 3.  If no results by am, use dulcolax suppository in the morning.  thanks

## 2011-02-18 NOTE — Telephone Encounter (Signed)
I spoke with pt and he states he has been constipated since Sunday. He has been drinking hot prune juice, miralax, and taking colace w/o relief. Pt states he needs something stronger to help "relieve" him. Please advise Dr. Kriste Basque, thanks  No Known Allergies

## 2011-03-13 ENCOUNTER — Other Ambulatory Visit (INDEPENDENT_AMBULATORY_CARE_PROVIDER_SITE_OTHER): Payer: Medicare Other

## 2011-03-13 DIAGNOSIS — R972 Elevated prostate specific antigen [PSA]: Secondary | ICD-10-CM

## 2011-03-13 DIAGNOSIS — F411 Generalized anxiety disorder: Secondary | ICD-10-CM

## 2011-03-13 DIAGNOSIS — E785 Hyperlipidemia, unspecified: Secondary | ICD-10-CM

## 2011-03-13 DIAGNOSIS — I959 Hypotension, unspecified: Secondary | ICD-10-CM

## 2011-03-13 DIAGNOSIS — Z87898 Personal history of other specified conditions: Secondary | ICD-10-CM

## 2011-03-13 DIAGNOSIS — D126 Benign neoplasm of colon, unspecified: Secondary | ICD-10-CM

## 2011-03-13 LAB — CBC WITH DIFFERENTIAL/PLATELET
Basophils Absolute: 0 10*3/uL (ref 0.0–0.1)
Eosinophils Absolute: 0.2 10*3/uL (ref 0.0–0.7)
Lymphocytes Relative: 26.4 % (ref 12.0–46.0)
MCHC: 33.5 g/dL (ref 30.0–36.0)
Monocytes Relative: 10.4 % (ref 3.0–12.0)
Neutro Abs: 4 10*3/uL (ref 1.4–7.7)
Neutrophils Relative %: 60.2 % (ref 43.0–77.0)
Platelets: 142 10*3/uL — ABNORMAL LOW (ref 150.0–400.0)
RDW: 13.5 % (ref 11.5–14.6)

## 2011-03-13 LAB — BASIC METABOLIC PANEL
CO2: 32 mEq/L (ref 19–32)
Calcium: 8.8 mg/dL (ref 8.4–10.5)
Chloride: 100 mEq/L (ref 96–112)
Glucose, Bld: 100 mg/dL — ABNORMAL HIGH (ref 70–99)
Potassium: 4.9 mEq/L (ref 3.5–5.1)
Sodium: 138 mEq/L (ref 135–145)

## 2011-03-13 LAB — HEPATIC FUNCTION PANEL
ALT: 25 U/L (ref 0–53)
AST: 30 U/L (ref 0–37)
Albumin: 3.6 g/dL (ref 3.5–5.2)
Alkaline Phosphatase: 44 U/L (ref 39–117)
Bilirubin, Direct: 0.1 mg/dL (ref 0.0–0.3)
Total Protein: 5.6 g/dL — ABNORMAL LOW (ref 6.0–8.3)

## 2011-03-13 LAB — LIPID PANEL
HDL: 53.3 mg/dL (ref >39.00)
Total CHOL/HDL Ratio: 2

## 2011-03-13 LAB — TSH: TSH: 1.04 u[IU]/mL (ref 0.35–5.50)

## 2011-03-25 ENCOUNTER — Encounter: Payer: Self-pay | Admitting: Pulmonary Disease

## 2011-03-25 ENCOUNTER — Ambulatory Visit (INDEPENDENT_AMBULATORY_CARE_PROVIDER_SITE_OTHER): Payer: Medicare Other | Admitting: Pulmonary Disease

## 2011-03-25 VITALS — BP 92/54 | HR 63 | Temp 96.0°F | Ht 64.0 in | Wt 131.4 lb

## 2011-03-25 DIAGNOSIS — M1990 Unspecified osteoarthritis, unspecified site: Secondary | ICD-10-CM

## 2011-03-25 DIAGNOSIS — E785 Hyperlipidemia, unspecified: Secondary | ICD-10-CM

## 2011-03-25 DIAGNOSIS — Z87898 Personal history of other specified conditions: Secondary | ICD-10-CM

## 2011-03-25 DIAGNOSIS — M545 Low back pain: Secondary | ICD-10-CM

## 2011-03-25 DIAGNOSIS — I4510 Unspecified right bundle-branch block: Secondary | ICD-10-CM

## 2011-03-25 DIAGNOSIS — F411 Generalized anxiety disorder: Secondary | ICD-10-CM

## 2011-03-25 DIAGNOSIS — M810 Age-related osteoporosis without current pathological fracture: Secondary | ICD-10-CM

## 2011-03-25 DIAGNOSIS — R269 Unspecified abnormalities of gait and mobility: Secondary | ICD-10-CM | POA: Insufficient documentation

## 2011-03-25 DIAGNOSIS — R609 Edema, unspecified: Secondary | ICD-10-CM

## 2011-03-25 DIAGNOSIS — I2510 Atherosclerotic heart disease of native coronary artery without angina pectoris: Secondary | ICD-10-CM

## 2011-03-25 DIAGNOSIS — I872 Venous insufficiency (chronic) (peripheral): Secondary | ICD-10-CM

## 2011-03-25 DIAGNOSIS — I6529 Occlusion and stenosis of unspecified carotid artery: Secondary | ICD-10-CM
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
## 2011-03-25 NOTE — Patient Instructions (Signed)
Today we updated your med list in our EPIC system...   We reviewed your recent blood work & gave you a copy for your records...  For your swelling:      NO SALT in diet...    ELEVATE your legs...    WEAR SUPPORT HOSE...    We decided to double your LASIX to 40mg  each AM...  Let's plan a brief follow up appt in 3 weeks to recheck your legs and your labs to be sure you are tolerating the 40mg  dose.Marland KitchenMarland Kitchen

## 2011-04-15 ENCOUNTER — Ambulatory Visit: Payer: Medicare Other | Admitting: Pulmonary Disease

## 2011-04-17 ENCOUNTER — Encounter: Payer: Self-pay | Admitting: Cardiology

## 2011-04-17 ENCOUNTER — Ambulatory Visit (INDEPENDENT_AMBULATORY_CARE_PROVIDER_SITE_OTHER): Payer: Medicare Other | Admitting: Cardiology

## 2011-04-17 VITALS — BP 98/50 | HR 57 | Ht 63.0 in | Wt 126.0 lb

## 2011-04-17 DIAGNOSIS — I2510 Atherosclerotic heart disease of native coronary artery without angina pectoris: Secondary | ICD-10-CM

## 2011-04-17 DIAGNOSIS — R001 Bradycardia, unspecified: Secondary | ICD-10-CM

## 2011-04-17 DIAGNOSIS — I498 Other specified cardiac arrhythmias: Secondary | ICD-10-CM

## 2011-04-17 DIAGNOSIS — I779 Disorder of arteries and arterioles, unspecified: Secondary | ICD-10-CM | POA: Insufficient documentation

## 2011-04-17 MED ORDER — METOPROLOL SUCCINATE ER 25 MG PO TB24: 12.5000 mg | ORAL_TABLET | Freq: Every day | ORAL | Status: DC

## 2011-04-17 NOTE — Patient Instructions (Addendum)
Your physician wants you to follow-up in: 6 months.   You will receive a reminder letter in the mail two months in advance. If you don't receive a letter, please call our office to schedule the follow-up appointment.  Your physician has recommended you make the following change in your medication: Cut your metoprolol in half and take one-half tab once daily

## 2011-04-17 NOTE — Assessment & Plan Note (Signed)
Coronary disease is stable. No further workup needed. 
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
## 2011-04-17 NOTE — Assessment & Plan Note (Signed)
Today his heart rate is 57. In addition his systolic blood pressure is fairly low. I decided to cut his beta blocker down to 12.5 mg daily.

## 2011-05-12 ENCOUNTER — Other Ambulatory Visit (HOSPITAL_COMMUNITY): Payer: Self-pay

## 2011-05-12 MED ORDER — FUROSEMIDE 20 MG PO TABS: 20.0000 mg | ORAL_TABLET | ORAL | Status: DC

## 2011-05-18 IMAGING — CT CT CHEST W/O CM
2 of 3 series · 15 of 36 positions shown, 18 images · non-contrast
Comparison: Chest radiograph 05/26/2009

CLINICAL DATA: Evaluate pneumothorax seen on chest radiograph

CT CHEST WITHOUT CONTRAST
TECHNIQUE: Multidetector CT imaging of the chest was performed
following the standard protocol without IV contrast.

[Series 2: routine chest · axial · 0.68mm/px · z∈[-253,+12]mm · 12 of 62 slices shown, 15 images]
[im 5/62  mediastinal]
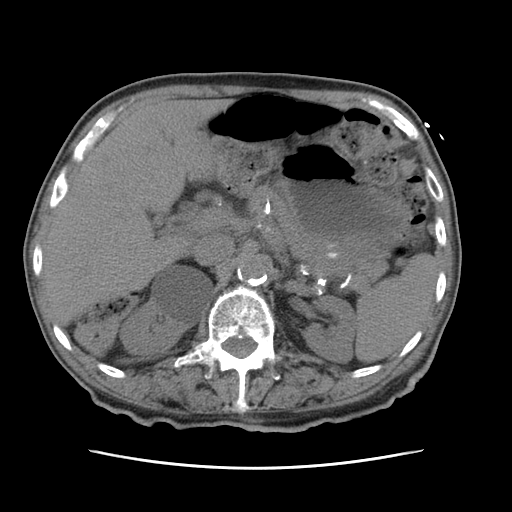
[im 5/62  lung]
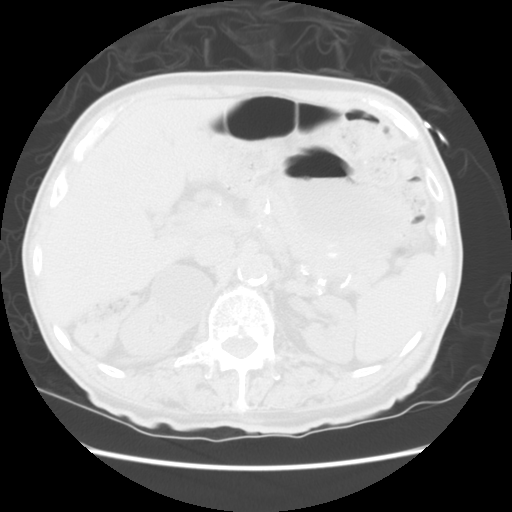
[im 10/62  lung]
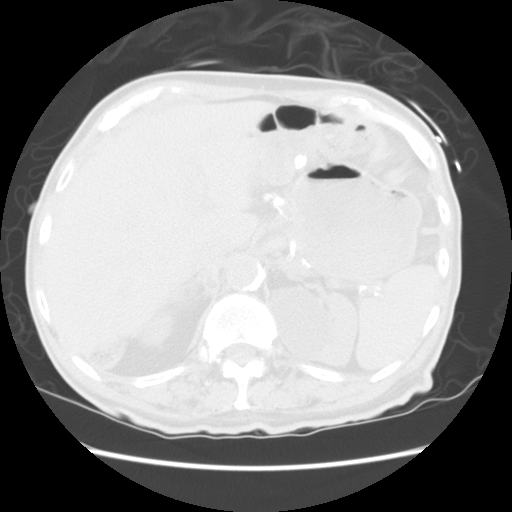
[im 14/62  lung]
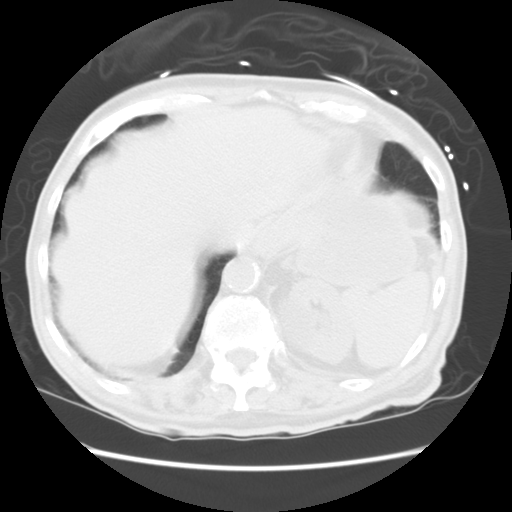
[im 19/62  lung]
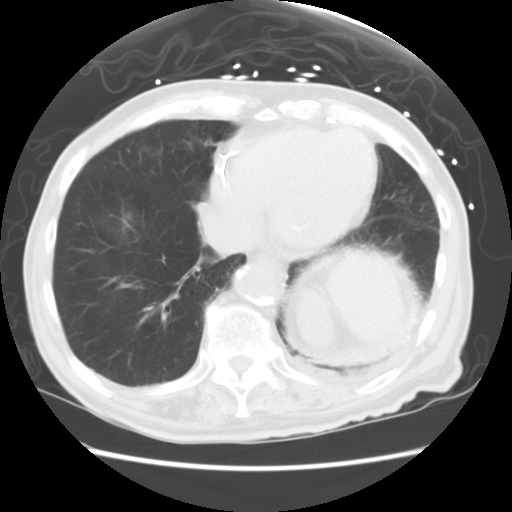
[im 23/62  mediastinal]
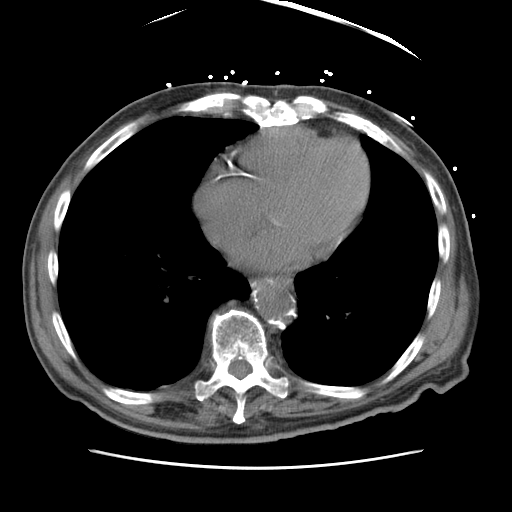
[im 23/62  lung]
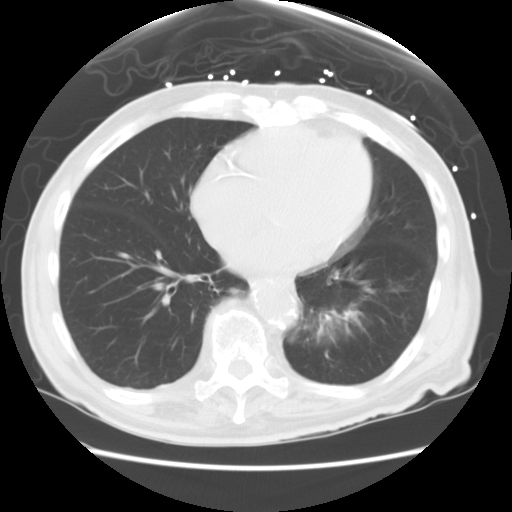
[im 28/62  lung]
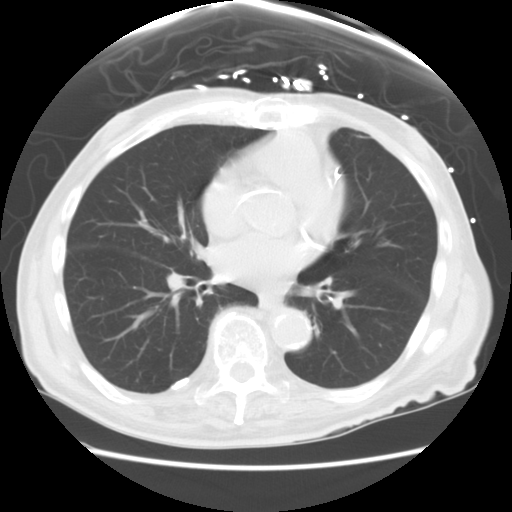
[im 34/62  lung]
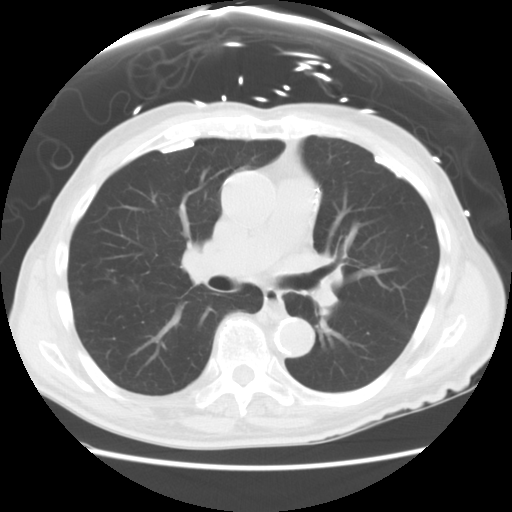
[im 39/62  lung]
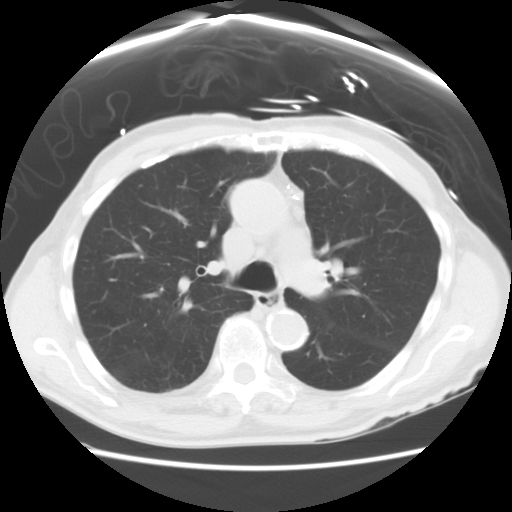
[im 43/62  mediastinal]
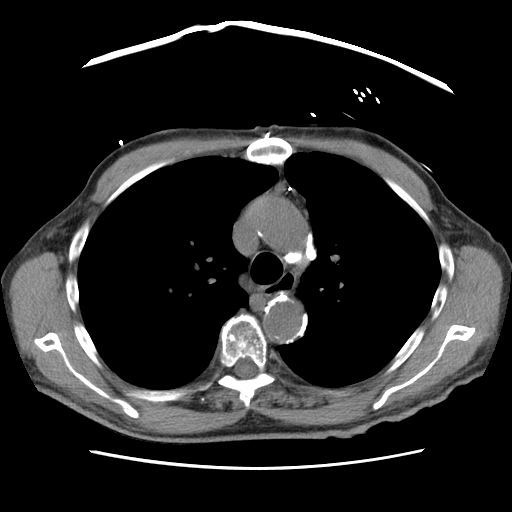
[im 43/62  lung]
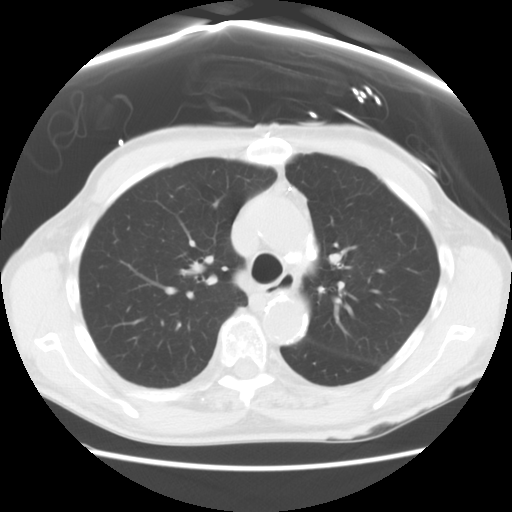
[im 48/62  lung]
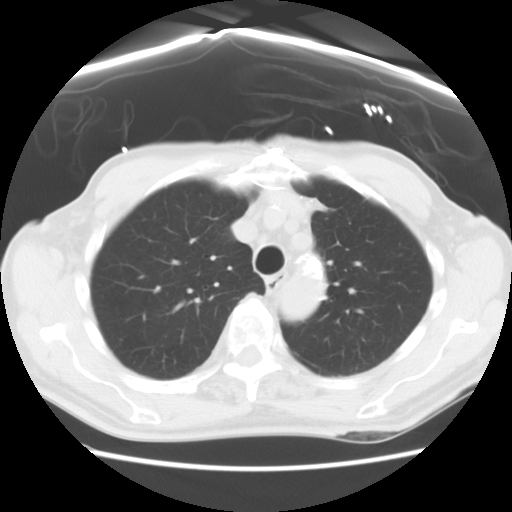
[im 52/62  lung]
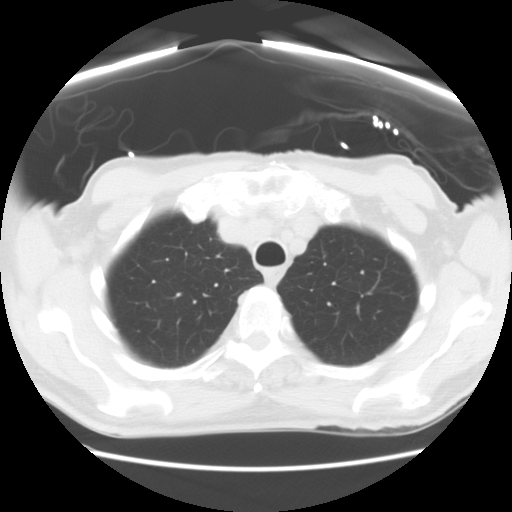
[im 57/62  lung]
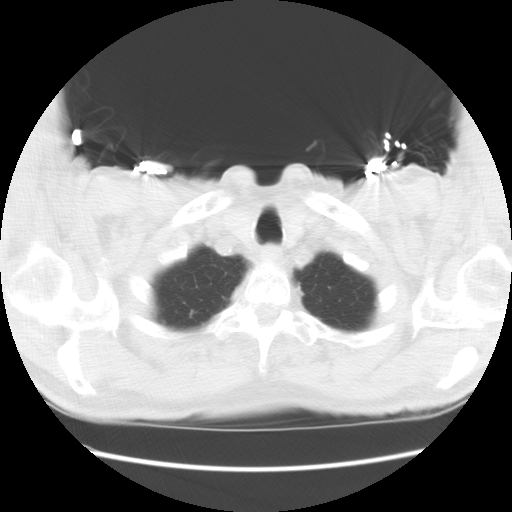

[Series 401: cor · coronal · 0.68mm/px · 3 of 83 slices shown]
[im 17/83  lung]
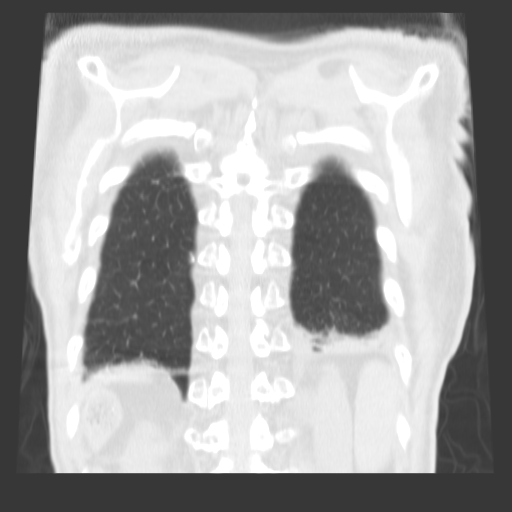
[im 33/83  lung]
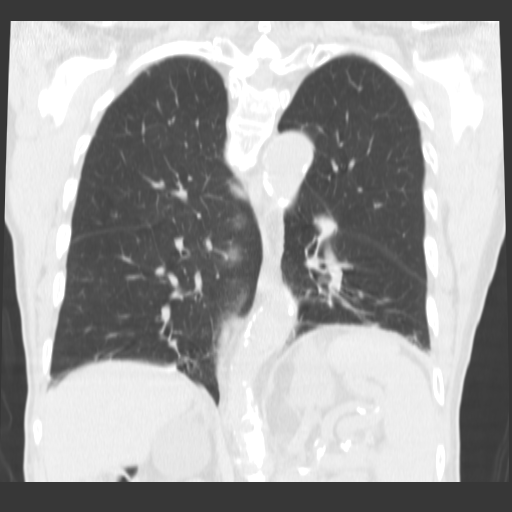
[im 50/83  lung]
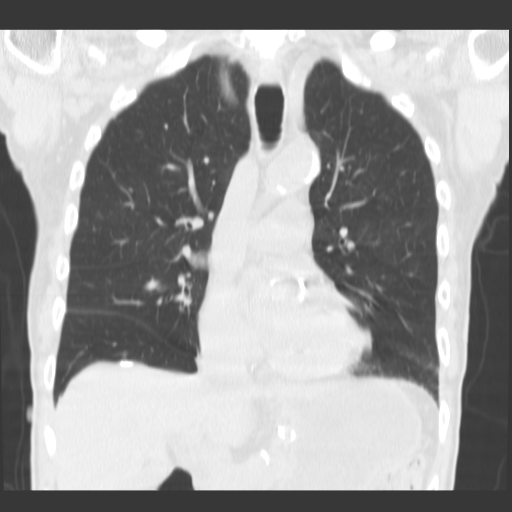

[15 of 36 positions shown; findings below may reference images not displayed]

FINDINGS: There is no pneumothorax within the left hemithorax.
Abnormality on plain film likely represents  a skin fold or sheet.
No evidence of right pneumothorax.  No evidence of pleural fluid,
pulmonary edema, or consolidation.  There are several calcified
pleural plaques on the anterior chest wall.  Airways are normal.

No evidence of axillary or supraclavicular lymphadenopathy.  No
mediastinal or hilar lymphadenopathy.  No acute findings aorta
great vessels.  There is intimal calcification of the thoracic
aorta.

Limited view of the upper abdomen demonstrates a simple cysts in
the right kidney and left kidney.  Adrenal glands appear normal.
Arterial vascular calcifications within the upper abdomen.

Limited view of the skeleton demonstrates midline sternotomy.  No
aggressive or acute findings.
IMPRESSION: 1.  No pneumothorax.
2.  Calcified pleural plaques.
3.  Atherosclerotic vascular calcifications.

## 2011-05-18 IMAGING — CR DG CHEST DECUBITUS*L*
1 series · 1 of 1 positions shown · non-contrast
Comparison: 05/26/2009 CT

CLINICAL DATA: Follow up pneumothorax

CHEST - LEFT DECUBITUS

[w chest decub.]
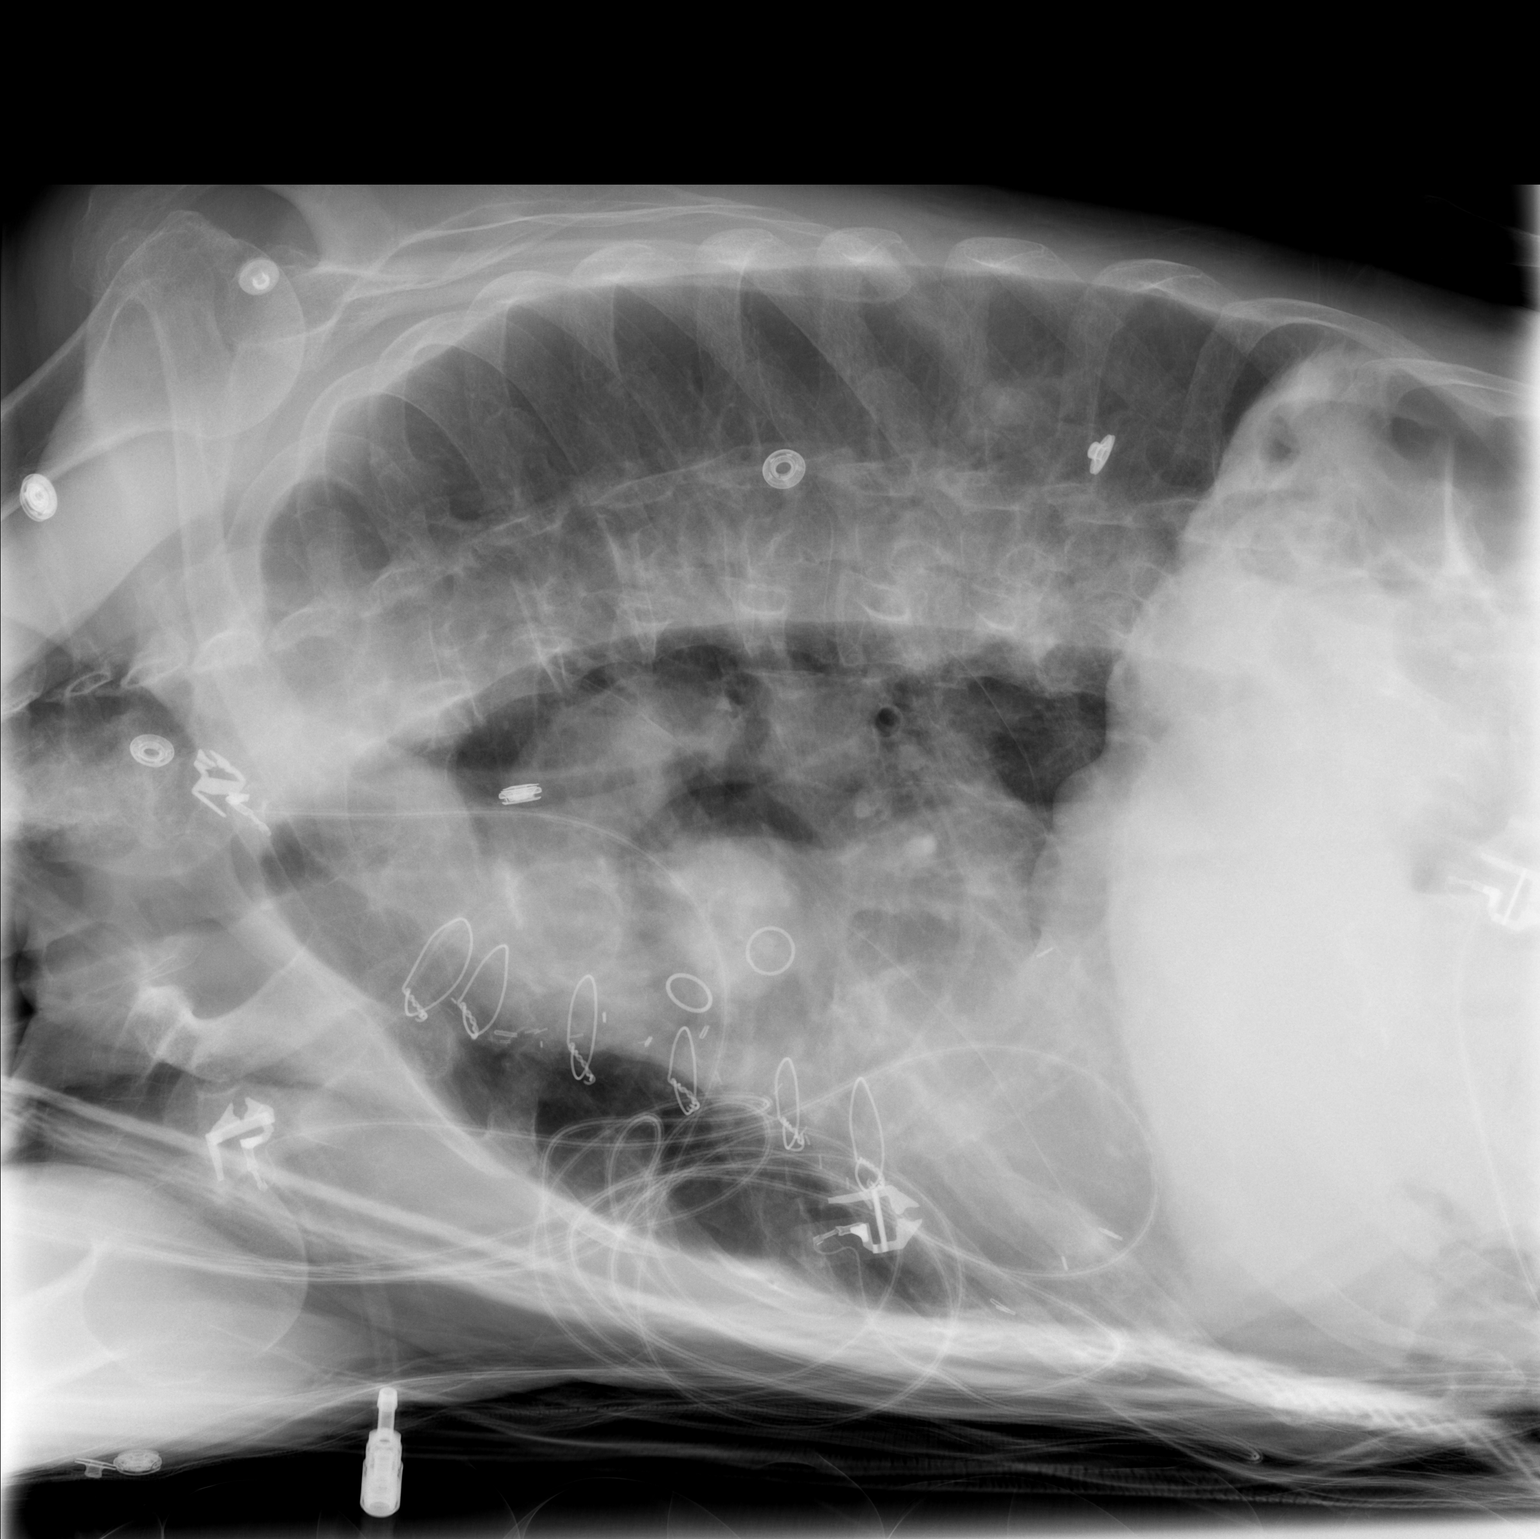

[1 of 1 positions shown; findings below may reference images not displayed]

FINDINGS: Positioning is suboptimal.  No layering effusion is
identified.  No change.
IMPRESSION: No layering pleural effusion or pneumothorax identified.

## 2011-05-27 ENCOUNTER — Other Ambulatory Visit: Payer: Self-pay | Admitting: Cardiology

## 2011-05-27 NOTE — Telephone Encounter (Signed)
Fax Received. Refill Completed. [name] (R.M.A)   

## 2011-06-17 ENCOUNTER — Ambulatory Visit
Admission: RE | Admit: 2011-06-17 | Discharge: 2011-06-17 | Disposition: A | Discharge status: Home / Self Care | Payer: Medicare Other | Source: Ambulatory Visit | Attending: Sports Medicine | Admitting: Sports Medicine

## 2011-06-17 ENCOUNTER — Other Ambulatory Visit: Payer: Self-pay | Admitting: Sports Medicine

## 2011-06-17 ENCOUNTER — Ambulatory Visit (INDEPENDENT_AMBULATORY_CARE_PROVIDER_SITE_OTHER): Payer: Medicare Other | Admitting: Sports Medicine

## 2011-06-17 VITALS — BP 116/80

## 2011-06-17 DIAGNOSIS — M542 Cervicalgia: Secondary | ICD-10-CM

## 2011-06-17 DIAGNOSIS — G8929 Other chronic pain: Secondary | ICD-10-CM

## 2011-06-17 NOTE — Assessment & Plan Note (Signed)
Neck x-rays were ordered  Use of a cervical collar for approximately 4 hours a day  Passive neck extension and shoulder extension  He uses Advil for relief and we will not change this or his other medications  Pending his response to this we will recheck this in next 1-2 months

## 2011-06-17 NOTE — Progress Notes (Signed)
Patient ID: [name], male   DOB: 09/30/1916, 78 y.o.   MRN: [id number]  HPI: 78 yo M with cardiac history and prior L hip replacement last seen 01/2010 for >85yr h/o L hip/groin pain now presents with neck and leg pain.  Neck pain: can't look up anymore, has been a slow gradual process over the past almost 1 year; friend convinced him to come be seen since she's tired of seeing him look down all of the time; pain with looking up and looking side to side; clicking noise in head when trying to look around inside his head; does have h/o RA; no pain, tingling, numbness, or radiation down his arms  Leg pain: bilaterally, ankles swell; sometimes sharp, sometimes achy; not as much pain with walking, notices more with sitting/laying.  Worst when laying in bed trying to go to sleep.+ weakness, not new.     PE: Filed Vitals:   06/17/11 0832  BP: 116/80   Gen: NAD, pleasant Neck: significant kyphosis of back; decreased ROM throughout His hands show marked swelling at the MCP joints bilaterally but no warmth or tenderness Legs: minimal swelling; no tenderness of knees  Flexion contracture at neck is ~ 30 deg Even with passive assistance he cannot get his neck to a neutral position He walks with assistance with a walker and really does not shuffle that much but his head position is shifted forward and downward significantly

## 2011-06-17 NOTE — Patient Instructions (Addendum)
Wear the collar all of the time when at home.  You can take it off when you go out.  We are going to get an Xray of his neck today.  When in a recliner or in bed, stretch your arms out straight and tuck your chin back and let your neck stretch out.   We will also have you start seeing a physical therapist to help get your neck up straighter.

## 2011-06-17 NOTE — Progress Notes (Signed)
Faxed PT order to [facility]- the facility where patient lives. [phone] phone and [phone] fax.

## 2011-06-18 ENCOUNTER — Telehealth: Payer: Self-pay | Admitting: *Deleted

## 2011-06-18 NOTE — Telephone Encounter (Signed)
Message copied by Mora Bellman on Thu Jun 18, 2011  4:20 PM ------      Message from: Enid Baas      Created: Thu Jun 18, 2011  3:31 PM       Let them know severe arthritis in neck with some slip of spine;            Needs to wear collar a lot as tolerated.

## 2011-06-18 NOTE — Telephone Encounter (Signed)
Left pt a VM to call back

## 2011-06-22 NOTE — Telephone Encounter (Signed)
[place] gave pt x-ray results via phone 06/19/11.

## 2011-07-08 ENCOUNTER — Other Ambulatory Visit (HOSPITAL_COMMUNITY): Payer: Self-pay | Admitting: Cardiology

## 2011-07-10 ENCOUNTER — Telehealth: Payer: Self-pay | Admitting: Pulmonary Disease

## 2011-07-10 NOTE — Telephone Encounter (Signed)
Received a fax from pharmacy asking for refill on lasix. Pt lasix was increased on 03/25/11 to 40mg  daily with instruct to f/u in 3 weeks with recheck on labs to make sure he was tolerating the increase well. Pt has not followed-up and no pending appts. Per Leigh the pt needs to have labs rechecked and set an appt before meds can be refilled. I [id number] to advise the pt. Carron Curie, CMA

## 2011-07-10 NOTE — Telephone Encounter (Signed)
Pt needs appointment then refill can be made Fax Received. Refill Completed. [name] (R.M.A)   

## 2011-07-13 MED ORDER — FUROSEMIDE 20 MG PO TABS: ORAL_TABLET | ORAL | Status: DC

## 2011-07-13 NOTE — Telephone Encounter (Signed)
Pt returned call.  Advised of recs regarding repeating his lab work and following up in the office.  Pt began to get agitated stating that he was not aware he was supposed to follow up with labs, though it clearly states this on his instruction sheet from the 03-25-11 ov.  Pt does not want to come in for labs/ov but would like to speak with SN directly.  Pt stated that he wants to discuss this "with someone, rather than be ordered around."  Advised patient that these are recommendations so that we may be sure that his body is tolerating the in increase in his lasix but pt was adamant.  Pt did not want to discuss specifically what he wants to discuss with SN.  Please call pt as requested at his home number.  Thanks.

## 2011-07-13 NOTE — Telephone Encounter (Signed)
Per SN---ok to refill the lasix  He does not need to get the labs---but will need a 6 month follow up that is due in sept 2013.  thanks

## 2011-07-13 NOTE — Telephone Encounter (Signed)
Pt aware of SN agreeing to give refill and patient scheduled appt with SN on Tuesday 09-15-11 at 2pm. Pt states at hi sOV in 03-2011 he was put on Lasix 40 mg 1 po qd and this was to much so he cut the tablet down to 30 mg qd. I spoke with Leigh and will inform SN as FYI about this matter. Rx has been sent for Lasix 30 mg 1 po qd #30 with 2 refills.

## 2011-08-01 ENCOUNTER — Other Ambulatory Visit: Payer: Self-pay | Admitting: Cardiology

## 2011-09-15 ENCOUNTER — Ambulatory Visit: Payer: Medicare Other | Admitting: Pulmonary Disease

## 2011-09-16 ENCOUNTER — Ambulatory Visit (INDEPENDENT_AMBULATORY_CARE_PROVIDER_SITE_OTHER): Payer: Medicare Other | Admitting: Pulmonary Disease

## 2011-09-16 ENCOUNTER — Encounter: Payer: Self-pay | Admitting: Pulmonary Disease

## 2011-09-16 ENCOUNTER — Ambulatory Visit: Payer: Medicare Other | Admitting: Pulmonary Disease

## 2011-09-16 VITALS — BP 104/52 | HR 67 | Temp 97.0°F | Ht 64.0 in | Wt 121.8 lb

## 2011-09-16 DIAGNOSIS — D126 Benign neoplasm of colon, unspecified: Secondary | ICD-10-CM

## 2011-09-16 DIAGNOSIS — M545 Low back pain: Secondary | ICD-10-CM

## 2011-09-16 DIAGNOSIS — I359 Nonrheumatic aortic valve disorder, unspecified: Secondary | ICD-10-CM

## 2011-09-16 DIAGNOSIS — I2510 Atherosclerotic heart disease of native coronary artery without angina pectoris: Secondary | ICD-10-CM

## 2011-09-16 DIAGNOSIS — F411 Generalized anxiety disorder: Secondary | ICD-10-CM

## 2011-09-16 DIAGNOSIS — K5730 Diverticulosis of large intestine without perforation or abscess without bleeding: Secondary | ICD-10-CM

## 2011-09-16 DIAGNOSIS — E785 Hyperlipidemia, unspecified: Secondary | ICD-10-CM

## 2011-09-16 DIAGNOSIS — Z77090 Contact with and (suspected) exposure to asbestos: Secondary | ICD-10-CM

## 2011-09-16 DIAGNOSIS — G8929 Other chronic pain: Secondary | ICD-10-CM

## 2011-09-16 DIAGNOSIS — I872 Venous insufficiency (chronic) (peripheral): Secondary | ICD-10-CM

## 2011-09-16 DIAGNOSIS — I779 Disorder of arteries and arterioles, unspecified: Secondary | ICD-10-CM

## 2011-09-16 DIAGNOSIS — I4510 Unspecified right bundle-branch block: Secondary | ICD-10-CM

## 2011-09-16 DIAGNOSIS — R5383 Other fatigue: Secondary | ICD-10-CM

## 2011-09-16 DIAGNOSIS — R609 Edema, unspecified: Secondary | ICD-10-CM

## 2011-09-16 DIAGNOSIS — Z87898 Personal history of other specified conditions: Secondary | ICD-10-CM

## 2011-09-16 DIAGNOSIS — K219 Gastro-esophageal reflux disease without esophagitis: Secondary | ICD-10-CM

## 2011-09-16 DIAGNOSIS — M1990 Unspecified osteoarthritis, unspecified site: Secondary | ICD-10-CM

## 2011-09-16 DIAGNOSIS — I358 Other nonrheumatic aortic valve disorders: Secondary | ICD-10-CM

## 2011-09-16 NOTE — Patient Instructions (Addendum)
Today we updated your med list in our EPIC system...    Continue your current medications the same...  Try to get in 1/2 to 1 can of a nutritional supplement like ENSURE, SUSTACAL, BOOST about 3 times daily...  Stay as active as possible 7 do some exercise every day, even if it's just "chair exercises"  Call for any questions...  Let's plan a follow up visit in 6 months (w/ FASTING blood work around that time).Marland KitchenMarland Kitchen
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
## 2011-09-19 ENCOUNTER — Encounter: Payer: Self-pay | Admitting: Pulmonary Disease

## 2011-10-17 ENCOUNTER — Other Ambulatory Visit: Payer: Self-pay | Admitting: Pulmonary Disease

## 2011-11-06 ENCOUNTER — Other Ambulatory Visit: Payer: Self-pay | Admitting: Cardiology

## 2011-11-06 MED ORDER — METOPROLOL SUCCINATE ER 25 MG PO TB24: 12.5000 mg | ORAL_TABLET | Freq: Every day | ORAL | Status: DC

## 2011-11-06 MED ORDER — ATORVASTATIN CALCIUM 20 MG PO TABS: 20.0000 mg | ORAL_TABLET | Freq: Every day | ORAL | Status: DC

## 2012-01-15 ENCOUNTER — Telehealth: Payer: Self-pay | Admitting: Pulmonary Disease

## 2012-01-15 DIAGNOSIS — R5381 Other malaise: Secondary | ICD-10-CM

## 2012-01-15 DIAGNOSIS — E785 Hyperlipidemia, unspecified: Secondary | ICD-10-CM

## 2012-01-15 DIAGNOSIS — I959 Hypotension, unspecified: Secondary | ICD-10-CM

## 2012-01-15 DIAGNOSIS — M810 Age-related osteoporosis without current pathological fracture: Secondary | ICD-10-CM

## 2012-01-15 NOTE — Telephone Encounter (Signed)
Spoke with [name] She states that she does not think that the pt is even taking his lasix 20 mg at all She is to call and check with him before we make any changes to his meds Will await a call back

## 2012-01-15 NOTE — Telephone Encounter (Addendum)
Feet and ankles are more swollen now and are purple in color but this is normal for the pt.  The swelling that is worse now is not normal for him.  Pt is elevating his feet with no relief.  Pt is wanting to know what can be done for this.  Not sure that the pt is taking the lasix since he is not able to get around very much at all per caregiver. Pt has an appt on  Monday with podiatry for this per pts caregiver.   SN please advise. Thanks  No Known Allergies

## 2012-01-15 NOTE — Telephone Encounter (Signed)
pts friend called back and stated that the pt is going to increase the lasix to 40 mg daily to help with the swelling.  She is aware of appt on 1/23 at 2pm.  Peggy requested that i call the pt and make him aware.  i called him and he is aware and wanted to know who was going to wrap his legs and make his meals and change his diaper.  i advised the pt that we could send out a nurse to evaluate and treat and the pt voiced his understanding of this and SN was ok with [facility] doing this.  Order has been sent and nothing further is needed.

## 2012-01-15 NOTE — Telephone Encounter (Signed)
Per SN---no salt at all, elevate legs and wrap with ace bandages,  Increase the lasix 20 mg  2 tabs every morning and will need rov in 2 wks with SN.   Ok for the podiatrist appt.  Ok to use 2/23 at 2pm.

## 2012-01-18 ENCOUNTER — Telehealth: Payer: Self-pay | Admitting: Pulmonary Disease

## 2012-01-18 NOTE — Telephone Encounter (Signed)
Angel nurse with [facility] says she went out to do admission visit on pt and he didn't want anything done to his leg until he sees dr Kriste Basque says his right toes are bluish//purple and the front of his left leg is real shiny and warm to touch can be reached at [phone].Raylene Everts

## 2012-01-18 NOTE — Telephone Encounter (Signed)
Per SN---advil is helping--cont this,  Take the lasix 40 mg every morning, no salt, wrap the legs in ACE wraps to help with the swelling, and keep appt for tomorrow.  thanks

## 2012-01-18 NOTE — Telephone Encounter (Signed)
Called, spoke with Angie, home health nurse, who states she went to do admission visit today with pt.  States pt declined their services until he is seen by Dr. Kriste Basque.  Angie states she did inform pt to call back if it was determined by dr that he does want pt to have HH.  Angie states she wasn't able to do much because pt didn't sign a consent form but she did see that his right toes are purplish blue, and his left leg looks like it may be getting cellulitis.  States the left leg is red, warm to touch, and shinny in some places on the front part.  Per Angie, pt does have good pulses but his right leg is swollen almost up to his knees and is probably pitting.  Angie states that his left leg is probably pitting and is swollen probably past his knee.  Thinks pt does need to be seen soon.  Advised pt does have pending OV with TP tomorrow.  Will route to SN as additional information regarding below request per Peggy.

## 2012-01-18 NOTE — Telephone Encounter (Signed)
Peggy called again in ref to previous msg says that pt's home health nurse says he needs to be see asap can be reached at [phone].Raylene Everts

## 2012-01-18 NOTE — Telephone Encounter (Signed)
Called, spoke with Peggy.  Informed her of below per Dr. Kriste Basque.  She verbalized understanding of this but stated she "couldn't promise any of these things other than keeping the appt tomorrow."  [name] states she will have pt come in tomorrow to see TP and can discuss things further during this time.

## 2012-01-18 NOTE — Telephone Encounter (Signed)
Called, spoke with Peggy.  States pt was seen by [facility] nurse today who, per [name], states "He really needs to get to a dr."  [name] states pt's swelling is no worse since call on Friday but is "double the size they should be."  Reports pt has bilat ankle and foot swelling to the point pt cannot put his sock on, ankles and feet are purple and "hard," and pt is in "excruciating pain in the leg with the hip replacement."  States pt is taking advil for the pain which is helping, is sitting in a recliner, taking lasix 40 mg qam.  She is requesting OV as per directed from [facility] RN.  We have scheduled this for tomorrow, Jan 14 at 2:30 pm with TP.  Peggy aware and would: 1.  Like to know what SN recs pt do in the meantime 2.  If SN would be kind enough to stick his head in during visit tomorrow with TP.  Advised I could not guarantee this but would let SN and Leigh know of this request.  She verbalized understanding. Dr. Kriste Basque, pls advise.  Thank you.

## 2012-01-19 ENCOUNTER — Emergency Department (HOSPITAL_COMMUNITY): Payer: Medicare Other

## 2012-01-19 ENCOUNTER — Encounter (HOSPITAL_COMMUNITY): Payer: Self-pay | Admitting: Internal Medicine

## 2012-01-19 ENCOUNTER — Encounter: Payer: Self-pay | Admitting: Adult Health

## 2012-01-19 ENCOUNTER — Inpatient Hospital Stay (HOSPITAL_COMMUNITY)
Admission: EM | Admit: 2012-01-19 | Discharge: 2012-01-25 | DRG: 603 | Disposition: A | Discharge status: Skilled Nursing Facility | Payer: Medicare Other | Attending: Internal Medicine | Admitting: Internal Medicine

## 2012-01-19 ENCOUNTER — Ambulatory Visit (INDEPENDENT_AMBULATORY_CARE_PROVIDER_SITE_OTHER): Payer: Medicare Other | Admitting: Adult Health

## 2012-01-19 VITALS — BP 80/52 | HR 74 | Temp 97.7°F | Ht 64.0 in | Wt 129.4 lb

## 2012-01-19 DIAGNOSIS — G8929 Other chronic pain: Secondary | ICD-10-CM

## 2012-01-19 DIAGNOSIS — R404 Transient alteration of awareness: Secondary | ICD-10-CM | POA: Diagnosis not present

## 2012-01-19 DIAGNOSIS — R55 Syncope and collapse: Secondary | ICD-10-CM

## 2012-01-19 DIAGNOSIS — K5730 Diverticulosis of large intestine without perforation or abscess without bleeding: Secondary | ICD-10-CM

## 2012-01-19 DIAGNOSIS — M542 Cervicalgia: Secondary | ICD-10-CM

## 2012-01-19 DIAGNOSIS — M79609 Pain in unspecified limb: Secondary | ICD-10-CM

## 2012-01-19 DIAGNOSIS — R269 Unspecified abnormalities of gait and mobility: Secondary | ICD-10-CM

## 2012-01-19 DIAGNOSIS — M545 Low back pain, unspecified: Secondary | ICD-10-CM

## 2012-01-19 DIAGNOSIS — R5381 Other malaise: Secondary | ICD-10-CM

## 2012-01-19 DIAGNOSIS — I340 Nonrheumatic mitral (valve) insufficiency: Secondary | ICD-10-CM

## 2012-01-19 DIAGNOSIS — R600 Localized edema: Secondary | ICD-10-CM | POA: Diagnosis present

## 2012-01-19 DIAGNOSIS — J309 Allergic rhinitis, unspecified: Secondary | ICD-10-CM

## 2012-01-19 DIAGNOSIS — R001 Bradycardia, unspecified: Secondary | ICD-10-CM

## 2012-01-19 DIAGNOSIS — K219 Gastro-esophageal reflux disease without esophagitis: Secondary | ICD-10-CM

## 2012-01-19 DIAGNOSIS — L0291 Cutaneous abscess, unspecified: Secondary | ICD-10-CM

## 2012-01-19 DIAGNOSIS — I4510 Unspecified right bundle-branch block: Secondary | ICD-10-CM

## 2012-01-19 DIAGNOSIS — I509 Heart failure, unspecified: Secondary | ICD-10-CM

## 2012-01-19 DIAGNOSIS — L02419 Cutaneous abscess of limb, unspecified: Principal | ICD-10-CM | POA: Diagnosis present

## 2012-01-19 DIAGNOSIS — R609 Edema, unspecified: Secondary | ICD-10-CM

## 2012-01-19 DIAGNOSIS — I872 Venous insufficiency (chronic) (peripheral): Secondary | ICD-10-CM

## 2012-01-19 DIAGNOSIS — Z87898 Personal history of other specified conditions: Secondary | ICD-10-CM

## 2012-01-19 DIAGNOSIS — R972 Elevated prostate specific antigen [PSA]: Secondary | ICD-10-CM

## 2012-01-19 DIAGNOSIS — L0390 Cellulitis, unspecified: Secondary | ICD-10-CM | POA: Diagnosis present

## 2012-01-19 DIAGNOSIS — F411 Generalized anxiety disorder: Secondary | ICD-10-CM

## 2012-01-19 DIAGNOSIS — I779 Disorder of arteries and arterioles, unspecified: Secondary | ICD-10-CM

## 2012-01-19 DIAGNOSIS — K209 Esophagitis, unspecified without bleeding: Secondary | ICD-10-CM

## 2012-01-19 DIAGNOSIS — R109 Unspecified abdominal pain: Secondary | ICD-10-CM

## 2012-01-19 DIAGNOSIS — E785 Hyperlipidemia, unspecified: Secondary | ICD-10-CM

## 2012-01-19 DIAGNOSIS — D126 Benign neoplasm of colon, unspecified: Secondary | ICD-10-CM

## 2012-01-19 DIAGNOSIS — I358 Other nonrheumatic aortic valve disorders: Secondary | ICD-10-CM

## 2012-01-19 DIAGNOSIS — M1990 Unspecified osteoarthritis, unspecified site: Secondary | ICD-10-CM

## 2012-01-19 DIAGNOSIS — Z951 Presence of aortocoronary bypass graft: Secondary | ICD-10-CM

## 2012-01-19 DIAGNOSIS — K222 Esophageal obstruction: Secondary | ICD-10-CM

## 2012-01-19 DIAGNOSIS — I2510 Atherosclerotic heart disease of native coronary artery without angina pectoris: Secondary | ICD-10-CM

## 2012-01-19 DIAGNOSIS — R5383 Other fatigue: Secondary | ICD-10-CM

## 2012-01-19 DIAGNOSIS — R1314 Dysphagia, pharyngoesophageal phase: Secondary | ICD-10-CM

## 2012-01-19 DIAGNOSIS — M810 Age-related osteoporosis without current pathological fracture: Secondary | ICD-10-CM

## 2012-01-19 DIAGNOSIS — Z77090 Contact with and (suspected) exposure to asbestos: Secondary | ICD-10-CM

## 2012-01-19 DIAGNOSIS — E46 Unspecified protein-calorie malnutrition: Secondary | ICD-10-CM | POA: Diagnosis present

## 2012-01-19 DIAGNOSIS — IMO0002 Reserved for concepts with insufficient information to code with codable children: Secondary | ICD-10-CM

## 2012-01-19 DIAGNOSIS — R410 Disorientation, unspecified: Secondary | ICD-10-CM

## 2012-01-19 DIAGNOSIS — K5900 Constipation, unspecified: Secondary | ICD-10-CM

## 2012-01-19 DIAGNOSIS — I959 Hypotension, unspecified: Secondary | ICD-10-CM

## 2012-01-19 DIAGNOSIS — Z66 Do not resuscitate: Secondary | ICD-10-CM | POA: Diagnosis present

## 2012-01-19 LAB — CBC
MCHC: 34.3 g/dL (ref 30.0–36.0)
MCV: 94.7 fL (ref 78.0–100.0)
Platelets: 178 10*3/uL (ref 150–400)
RDW: 13.6 % (ref 11.5–15.5)
WBC: 8.1 10*3/uL (ref 4.0–10.5)

## 2012-01-19 LAB — COMPREHENSIVE METABOLIC PANEL
Albumin: 3.1 g/dL — ABNORMAL LOW (ref 3.5–5.2)
Alkaline Phosphatase: 62 U/L (ref 39–117)
BUN: 37 mg/dL — ABNORMAL HIGH (ref 6–23)
Calcium: 9.4 mg/dL (ref 8.4–10.5)
Potassium: 4.5 mEq/L (ref 3.5–5.1)
Total Protein: 6.5 g/dL (ref 6.0–8.3)

## 2012-01-19 MED ORDER — ONDANSETRON HCL 4 MG PO TABS: 4.0000 mg | ORAL_TABLET | Freq: Four times a day (QID) | ORAL | Status: DC | PRN

## 2012-01-19 MED ORDER — LORATADINE 10 MG PO TABS: 10.0000 mg | ORAL_TABLET | Freq: Every day | ORAL | Status: DC

## 2012-01-19 MED ORDER — ONDANSETRON HCL 4 MG/2ML IJ SOLN: 4.0000 mg | Freq: Four times a day (QID) | INTRAMUSCULAR | Status: DC | PRN

## 2012-01-19 MED ORDER — CIPROFLOXACIN IN D5W 400 MG/200ML IV SOLN
400.0000 mg | Freq: Once | INTRAVENOUS | Status: AC
  Administered 2012-01-20: 400 mg via INTRAVENOUS
  Filled 2012-01-19: qty 200

## 2012-01-19 MED ORDER — SODIUM CHLORIDE 0.9 % IJ SOLN
3.0000 mL | Freq: Two times a day (BID) | INTRAMUSCULAR | Status: DC
  Administered 2012-01-19 – 2012-01-20 (×2): 3 mL via INTRAVENOUS
  Administered 2012-01-21: via INTRAVENOUS
  Administered 2012-01-22 – 2012-01-25 (×4): 3 mL via INTRAVENOUS

## 2012-01-19 MED ORDER — ASPIRIN 325 MG PO TABS
325.0000 mg | ORAL_TABLET | Freq: Every day | ORAL | Status: DC
  Administered 2012-01-20 – 2012-01-25 (×6): 325 mg via ORAL
  Filled 2012-01-19 (×6): qty 1

## 2012-01-19 MED ORDER — VANCOMYCIN HCL IN DEXTROSE 1-5 GM/200ML-% IV SOLN
1000.0000 mg | Freq: Once | INTRAVENOUS | Status: AC
  Administered 2012-01-19: 1000 mg via INTRAVENOUS
  Filled 2012-01-19: qty 200

## 2012-01-19 MED ORDER — VITAMIN D 1000 UNITS PO TABS
2000.0000 [IU] | ORAL_TABLET | Freq: Every day | ORAL | Status: DC
  Administered 2012-01-20 – 2012-01-25 (×6): 2000 [IU] via ORAL
  Filled 2012-01-19 (×6): qty 2

## 2012-01-19 MED ORDER — ATORVASTATIN CALCIUM 20 MG PO TABS
20.0000 mg | ORAL_TABLET | Freq: Every day | ORAL | Status: DC
  Administered 2012-01-20 – 2012-01-24 (×5): 20 mg via ORAL
  Filled 2012-01-19 (×6): qty 1

## 2012-01-19 MED ORDER — METOPROLOL SUCCINATE 12.5 MG HALF TABLET
12.5000 mg | ORAL_TABLET | Freq: Every day | ORAL | Status: DC
  Administered 2012-01-20 – 2012-01-22 (×3): 12.5 mg via ORAL
  Administered 2012-01-23: 11:00:00 via ORAL
  Administered 2012-01-25: 12.5 mg via ORAL
  Filled 2012-01-19 (×6): qty 1

## 2012-01-19 MED ORDER — ATORVASTATIN CALCIUM 20 MG PO TABS: 20.0000 mg | ORAL_TABLET | Freq: Every day | ORAL | Status: DC

## 2012-01-19 MED ORDER — ACETAMINOPHEN 325 MG PO TABS
650.0000 mg | ORAL_TABLET | Freq: Four times a day (QID) | ORAL | Status: DC | PRN
  Administered 2012-01-22: 650 mg via ORAL
  Filled 2012-01-19: qty 1
  Filled 2012-01-19: qty 2

## 2012-01-19 MED ORDER — VITAMIN B-6 50 MG PO TABS
50.0000 mg | ORAL_TABLET | Freq: Two times a day (BID) | ORAL | Status: DC
  Administered 2012-01-20 – 2012-01-25 (×12): 50 mg via ORAL
  Filled 2012-01-19 (×14): qty 1

## 2012-01-19 MED ORDER — ACETAMINOPHEN 650 MG RE SUPP: 650.0000 mg | Freq: Four times a day (QID) | RECTAL | Status: DC | PRN

## 2012-01-19 MED ORDER — OMEGA-3-ACID ETHYL ESTERS 1 G PO CAPS
1.0000 g | ORAL_CAPSULE | Freq: Every day | ORAL | Status: DC
  Administered 2012-01-20 – 2012-01-25 (×6): 1 g via ORAL
  Filled 2012-01-19 (×6): qty 1

## 2012-01-19 MED ORDER — ENOXAPARIN SODIUM 40 MG/0.4ML ~~LOC~~ SOLN
40.0000 mg | Freq: Every day | SUBCUTANEOUS | Status: DC
  Administered 2012-01-20: 40 mg via SUBCUTANEOUS
  Filled 2012-01-19 (×2): qty 0.4

## 2012-01-19 MED ORDER — FUROSEMIDE 20 MG PO TABS
20.0000 mg | ORAL_TABLET | Freq: Every day | ORAL | Status: DC
  Administered 2012-01-20 – 2012-01-24 (×5): 20 mg via ORAL
  Filled 2012-01-19 (×5): qty 1

## 2012-01-19 MED ORDER — LORATADINE 10 MG PO TABS
10.0000 mg | ORAL_TABLET | Freq: Every day | ORAL | Status: DC
  Administered 2012-01-20 – 2012-01-25 (×6): 10 mg via ORAL
  Filled 2012-01-19 (×6): qty 1

## 2012-01-19 NOTE — ED Notes (Signed)
Attempted to call report to the floor, RN unable to take report at this time

## 2012-01-19 NOTE — ED Notes (Signed)
Pt sent from [facility]. Per MDs note, pt has increased bilateral LE edema and pain for one week. Pt denies wheezing, SOB, cough. Pt has increased Lasix to 40mg  x 2 days.

## 2012-01-19 NOTE — Patient Instructions (Addendum)
We recommend you be admitted to hospital  Go directly to [facility] Emergency room for evaluation.
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
## 2012-01-19 NOTE — Assessment & Plan Note (Signed)
Increased Bilateral Lower extremity edema ? Fluid overload w/ associated cellulitis complicated by hypotension  ? SIRS  Case discussed with Dr. Kriste Basque   Pt to be transported to [facility] ER - pt declined wants private vehicle transport across street.  Will go to ER for consideration of admission  W/ further evaluation and treatment.
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
## 2012-01-20 ENCOUNTER — Encounter (HOSPITAL_COMMUNITY): Payer: Self-pay | Admitting: General Practice

## 2012-01-20 ENCOUNTER — Ambulatory Visit (HOSPITAL_COMMUNITY): Payer: Medicare Other

## 2012-01-20 DIAGNOSIS — I7090 Unspecified atherosclerosis: Secondary | ICD-10-CM | POA: Insufficient documentation

## 2012-01-20 DIAGNOSIS — K5900 Constipation, unspecified: Secondary | ICD-10-CM

## 2012-01-20 DIAGNOSIS — I059 Rheumatic mitral valve disease, unspecified: Secondary | ICD-10-CM

## 2012-01-20 DIAGNOSIS — R0602 Shortness of breath: Secondary | ICD-10-CM | POA: Insufficient documentation

## 2012-01-20 DIAGNOSIS — M7989 Other specified soft tissue disorders: Secondary | ICD-10-CM

## 2012-01-20 DIAGNOSIS — J984 Other disorders of lung: Secondary | ICD-10-CM | POA: Insufficient documentation

## 2012-01-20 DIAGNOSIS — R799 Abnormal finding of blood chemistry, unspecified: Secondary | ICD-10-CM | POA: Insufficient documentation

## 2012-01-20 LAB — CBC
HCT: 32.2 % — ABNORMAL LOW (ref 39.0–52.0)
HCT: 32.6 % — ABNORMAL LOW (ref 39.0–52.0)
Hemoglobin: 11.2 g/dL — ABNORMAL LOW (ref 13.0–17.0)
MCH: 31.5 pg (ref 26.0–34.0)
MCHC: 33.3 g/dL (ref 30.0–36.0)
MCHC: 33.7 g/dL (ref 30.0–36.0)
MCHC: 34.5 g/dL (ref 30.0–36.0)
MCV: 95.3 fL (ref 78.0–100.0)
Platelets: 144 10*3/uL — ABNORMAL LOW (ref 150–400)
Platelets: 146 10*3/uL — ABNORMAL LOW (ref 150–400)
Platelets: 149 10*3/uL — ABNORMAL LOW (ref 150–400)
RDW: 13.4 % (ref 11.5–15.5)
RDW: 13.6 % (ref 11.5–15.5)
RDW: 13.7 % (ref 11.5–15.5)
WBC: 6.9 10*3/uL (ref 4.0–10.5)

## 2012-01-20 LAB — BASIC METABOLIC PANEL
BUN: 30 mg/dL — ABNORMAL HIGH (ref 6–23)
Calcium: 8.9 mg/dL (ref 8.4–10.5)
Creatinine, Ser: 1.06 mg/dL (ref 0.50–1.35)
GFR calc Af Amer: 68 mL/min — ABNORMAL LOW (ref >90)
GFR calc non Af Amer: 58 mL/min — ABNORMAL LOW (ref >90)
Glucose, Bld: 90 mg/dL (ref 70–99)

## 2012-01-20 LAB — CREATININE, SERUM: GFR calc non Af Amer: 55 mL/min — ABNORMAL LOW (ref >90)

## 2012-01-20 LAB — TROPONIN I: Troponin I: <0.3 ng/mL (ref <0.30)

## 2012-01-20 LAB — TSH: TSH: 0.998 u[IU]/mL (ref 0.350–4.500)

## 2012-01-20 MED ORDER — IOHEXOL 350 MG/ML SOLN
100.0000 mL | Freq: Once | INTRAVENOUS | Status: AC | PRN
  Administered 2012-01-20: 100 mL via INTRAVENOUS

## 2012-01-20 MED ORDER — POLYETHYLENE GLYCOL 3350 17 G PO PACK
17.0000 g | PACK | Freq: Every day | ORAL | Status: DC
  Administered 2012-01-20 – 2012-01-25 (×5): 17 g via ORAL
  Filled 2012-01-20 (×6): qty 1

## 2012-01-20 MED ORDER — VANCOMYCIN HCL 1000 MG IV SOLR
750.0000 mg | Freq: Every day | INTRAVENOUS | Status: DC
  Administered 2012-01-20 – 2012-01-24 (×5): 750 mg via INTRAVENOUS
  Filled 2012-01-20 (×6): qty 750

## 2012-01-20 MED ORDER — HYDROCODONE-ACETAMINOPHEN 5-325 MG PO TABS
1.0000 | ORAL_TABLET | Freq: Four times a day (QID) | ORAL | Status: DC | PRN
  Administered 2012-01-20 (×2): 1 via ORAL
  Filled 2012-01-20 (×2): qty 1

## 2012-01-20 MED ORDER — ENOXAPARIN SODIUM 40 MG/0.4ML ~~LOC~~ SOLN
40.0000 mg | SUBCUTANEOUS | Status: DC
  Administered 2012-01-21 – 2012-01-24 (×4): 40 mg via SUBCUTANEOUS
  Filled 2012-01-20 (×5): qty 0.4

## 2012-01-20 MED ORDER — ENOXAPARIN SODIUM 60 MG/0.6ML ~~LOC~~ SOLN
1.0000 mg/kg | Freq: Two times a day (BID) | SUBCUTANEOUS | Status: DC
  Administered 2012-01-20: 55 mg via SUBCUTANEOUS
  Filled 2012-01-20 (×2): qty 0.6

## 2012-01-20 MED ORDER — SENNA 8.6 MG PO TABS: 1.0000 | ORAL_TABLET | Freq: Every day | ORAL | Status: DC | PRN

## 2012-01-20 MED ORDER — CIPROFLOXACIN IN D5W 400 MG/200ML IV SOLN
400.0000 mg | Freq: Two times a day (BID) | INTRAVENOUS | Status: DC
  Administered 2012-01-20 – 2012-01-25 (×11): 400 mg via INTRAVENOUS
  Filled 2012-01-20 (×15): qty 200

## 2012-01-20 MED ORDER — DOCUSATE SODIUM 100 MG PO CAPS
100.0000 mg | ORAL_CAPSULE | Freq: Two times a day (BID) | ORAL | Status: DC
  Administered 2012-01-20 – 2012-01-25 (×11): 100 mg via ORAL
  Filled 2012-01-20 (×12): qty 1
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
## 2012-01-20 NOTE — Progress Notes (Signed)
*  PRELIMINARY RESULTS* Vascular Ultrasound Lower extremity venous duplex has been completed.  Preliminary findings: Bilateral:  No evidence of DVT, superficial thrombosis, or Baker's Cyst. Left posterior tibial and peroneal veins were not fully compressible, but demonstrate adequate flow with Color Doppler. The lack of compressibility could be due to edema and poor patient position.    Farrel Demark, RDMS, RVT 01/20/2012, 9:06 AM

## 2012-01-20 NOTE — Progress Notes (Signed)
Lovenox for VTE Prophylaxis  89 yom started on full dose Lovenox this AM for r/o VTE. LLE venous duplex with no DVT, CTangio with limited eval for segmental and subsegmental pulmonary emboli at the lung bases but otherwise, no PE present. MD ordered to d/c full dose Lovenox and convert to prophylaxis dose.  Plan: Lovenox 40 mg sq daily, to start tomorrow (1/16) @ 2200.  Pharmacy will sign off from Lovenox dosing protocol.    Thanks  Geoffry Paradise, PharmD, BCPS Pager: [phone] 6:02 PM Pharmacy #: [id number]

## 2012-01-20 NOTE — Progress Notes (Signed)
[facility] PROGRESS NOTE  [name] ZOX:[id number] DOB: Aug 01, 1916 DOA: 01/19/2012 PCP: Michele Mcalpine, MD  Assessment/Plan  #1. Lower extremity edema - most likely venous stasis dermatitis -  Duplex negative -  Cellulitis possible, continue vanc and cipro -  Venous stasis TED hose when able and elevate extremity -  ECHO demonstrates preserved EF and no mention of diastolic dysfunction.  PA pressure was mildly increased at -  UA to check for proteinuria  #2. Hypotension, resolving but blood pressures still low normal.  Patient does not look septic.  -  Holding antihypertensives.  -  Continue Lasix to 20 by mouth. #3. CHF last EF preserved 65%.  See above. #4. Hypertension - normotensive currently off BP mediations - see above  #5. CAD status post CABG -  Presently chest pain-free. EKG shows RBBB.  Troponin neg.  #6. Hyperlipidemia - continue present medications.  Constipation:  Add Colace, senna, miralax  Diet:  Healthy heart Access:  PIV IVF:  None Proph:  lovenox   Code Status: DNR Family Communication: spoke with patient alone Disposition Plan: pending further improvement in lower extremity edema and pain. PT/OT consults   Consultants:  None  Procedures:  ECHO  CTa  Duplex lower extremity  Antibiotics:  vanc  cipro   HPI/Subjective:  Patient continues to have left lower extremity pain.  Denies CP, SOB, N/V/D.  + constipation.    Objective: Filed Vitals:   01/19/12 2130 01/19/12 2200 01/19/12 2250 01/20/12 0737  BP: 106/53 93/63 92/35  110/45  Pulse:   64 69  Temp:   97.9 F (36.6 C) 97.9 F (36.6 C)  TempSrc:   Oral Oral  Resp: 15 21 20 20   Height:   5\' 6"  (1.676 m)   Weight:   58.514 kg (129 lb) 56.972 kg (125 lb 9.6 oz)  SpO2:   96% 93%    Intake/Output Summary (Last 24 hours) at 01/20/12 0829 Last data filed at 01/20/12 0700  Gross per 24 hour  Intake      3 ml  Output    550 ml  Net   -547 ml   Filed Weights   01/19/12 2250 01/20/12 0737  Weight: 58.514 kg (129 lb) 56.972 kg (125 lb 9.6 oz)    Exam:   General:  [name], no acute distress, thin  HEENT:  MMM, NCAT  Cardiovascular:  RRR, no m/r/g, 1+ pedal pulses, normal S1, S2  Respiratory:  CTAB, no increased WOB  Abdomen:  NABS, soft, nondistended, nontender, no organomegaly  MSK:  2+ BLE pitting edema  Neuro:  Grossly intact  Skin:  Purple red hue to the left shin area  Data Reviewed: Basic Metabolic Panel:  Lab 01/20/12 8119 01/19/12 2359 01/19/12 1800  NA 141 -- 144  K 4.2 -- 4.5  CL 105 -- 105  CO2 29 -- 31  GLUCOSE 90 -- 96  BUN 30* -- 37*  CREATININE 1.06 1.11 1.21  CALCIUM 8.9 -- 9.4  MG -- -- --  PHOS -- -- --   Liver Function Tests:  Lab 01/19/12 1800  AST 26  ALT 25  ALKPHOS 62  BILITOT 0.8  PROT 6.5  ALBUMIN 3.1*   No results found for this basename: LIPASE:5,AMYLASE:5 in the last 168 hours No results found for this basename: AMMONIA:5 in the last 168 hours CBC:  Lab 01/20/12 0504 01/19/12 2359 01/19/12 1800  WBC 6.7 6.9 8.1  NEUTROABS -- -- --  HGB 11.2* 11.1* 12.3*  HCT 33.6*  32.2* 35.9*  MCV 94.6 94.7 94.7  PLT 149* 144* 178   Cardiac Enzymes:  Lab 01/19/12 2359  CKTOTAL --  CKMB --  CKMBINDEX --  TROPONINI <0.30   BNP (last 3 results)  Basename 01/19/12 1800  PROBNP 1245.0*   CBG: No results found for this basename: GLUCAP:5 in the last 168 hours  No results found for this or any previous visit (from the past 240 hour(s)).   Studies: Dg Chest 2 View  01/19/2012  *RADIOLOGY REPORT*  Clinical Data: Leg swelling and weakness  CHEST - 2 VIEW  Comparison: 09/08/2010  Findings: Previous median sternotomy and CABG procedure.  Normal heart size.  No pleural effusions or edema.  Bibasilar atelectasis is noted.  No airspace consolidation identified.  Review of the visualized osseous structures is significant for mild spondylosis within the thoracic spine.  IMPRESSION:  1.  New  bibasilar atelectasis. 2.  No pneumonia or heart failure.   Original Report Authenticated By: Signa Kell, M.D.     Scheduled Meds:   . aspirin  325 mg Oral Daily  . atorvastatin  20 mg Oral q1800  . cholecalciferol  2,000 Units Oral Daily  . ciprofloxacin  400 mg Intravenous BID  . enoxaparin (LOVENOX) injection  40 mg Subcutaneous QHS  . furosemide  20 mg Oral Daily  . loratadine  10 mg Oral Daily  . metoprolol succinate  12.5 mg Oral Daily  . omega-3 acid ethyl esters  1 g Oral Daily  . pyridOXINE  50 mg Oral BID  . sodium chloride  3 mL Intravenous Q12H  . vancomycin  750 mg Intravenous QHS   Continuous Infusions:   Principal Problem:  *Lower extremity edema Active Problems:  HYPOTENSION  Coronary artery disease  Cellulitis    Time spent: 30 min    [name]  [facility] Pager [phone]. If 8PM-8AM, please contact night-coverage at [email or web address], password [facility] 01/20/2012, 8:29 AM  LOS: 1 day

## 2012-01-20 NOTE — ED Provider Notes (Signed)
Medical screening examination/treatment/procedure(s) were conducted as a shared visit with non-physician practitioner(s) and myself.  I personally evaluated the patient during the encounter.  Workup reveals evidence of congestive heart failure with significant lower extremity edema. Additionally left leg is red, warm to touch consistent with cellulitis. Patient admitted for treatment.  Gilda Crease, MD 01/20/12 [phone]
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
## 2012-01-20 NOTE — Progress Notes (Signed)
   CARE MANAGEMENT NOTE 01/20/2012  Patient:  DAELEN, BELVEDERE   Account Number:  [id number]  Date Initiated:  01/20/2012  Documentation initiated by:  Jiles Crocker  Subjective/Objective Assessment:   ADMITTED WITH LOWER EXTREMITY EDEMA, HYPOTENSION     Action/Plan:   PCP - Dr. Olean Ree.  ?STAYS WITH GIRLFRIEND PEGGY; AWAITING FOR PT/OT EVALS FOR DISPOSITION   Anticipated DC Date:  01/27/2012   Anticipated DC Plan:  HOME W HOME HEALTH SERVICES      DC Planning Services  CM consult         Status of service:  In process, will continue to follow Medicare Important Message given?  NA - LOS <3 / Initial given by admissions (If response is "NO", the following Medicare IM given date fields will be blank)  Per UR Regulation:  Reviewed for med. necessity/level of care/duration of stay  Comments:  01/20/2012- B Lucianna Ostlund RN,BSN,MHA

## 2012-01-20 NOTE — Progress Notes (Signed)
Echocardiogram 2D Echocardiogram has been performed.  [name] 01/20/2012, 10:08 AM

## 2012-01-21 LAB — BASIC METABOLIC PANEL
BUN: 29 mg/dL — ABNORMAL HIGH (ref 6–23)
Calcium: 8.6 mg/dL (ref 8.4–10.5)
Chloride: 101 mEq/L (ref 96–112)
Creatinine, Ser: 1.26 mg/dL (ref 0.50–1.35)
GFR calc Af Amer: 55 mL/min — ABNORMAL LOW (ref >90)

## 2012-01-21 MED ORDER — HYDROCODONE-ACETAMINOPHEN 5-325 MG PO TABS: 1.0000 | ORAL_TABLET | Freq: Four times a day (QID) | ORAL | Status: DC | PRN

## 2012-01-21 MED ORDER — HALOPERIDOL LACTATE 5 MG/ML IJ SOLN: 1.0000 mg | Freq: Four times a day (QID) | INTRAMUSCULAR | Status: DC | PRN

## 2012-01-21 MED ORDER — MAGNESIUM HYDROXIDE 400 MG/5ML PO SUSP: 30.0000 mL | Freq: Every day | ORAL | Status: DC | PRN

## 2012-01-21 MED ORDER — TRAMADOL HCL 50 MG PO TABS
50.0000 mg | ORAL_TABLET | Freq: Four times a day (QID) | ORAL | Status: DC | PRN
  Administered 2012-01-24 – 2012-01-25 (×2): 50 mg via ORAL
  Filled 2012-01-21 (×3): qty 1

## 2012-01-21 NOTE — Progress Notes (Signed)
[facility] PROGRESS NOTE  [name] ZOX:[id number] DOB: 1916/07/28 DOA: 01/19/2012 PCP: Michele Mcalpine, MD  Assessment/Plan  #1. Lower extremity edema - most likely venous stasis dermatitis with cellulitis -  Duplex negative -  Continue vanc and cipro -  Venous stasis TED hose when able and elevate extremity -  ECHO demonstrates preserved EF and no mention of diastolic dysfunction.  PA pressure was mildly increased at -  UA to check for proteinuria -  Ultram and tylenol for pain.    Delirium:  May be due to pain, pain medication, acute infection. -  Minimize sedating medications  -  Orient with lights on during day and off at night -  Up in chair during day -  Haldol prn  #2. Hypotension, resolving but blood pressures still low normal.  Patient does not look septic.  -  Holding antihypertensives.  -  Continue Lasix to 20 by mouth. #3. CHF last EF preserved 65%.  See above. #4. Hypertension - normotensive currently off BP mediations - see above  #5. CAD status post CABG -  Presently chest pain-free. EKG shows RBBB.  Troponin neg.  #6. Hyperlipidemia - continue present medications.  Constipation:  Add Colace, senna, miralax Depressed mood - spoke at length with patient about his relationships with family and friends, support system, etc.  Offered antidepressant, but patient declined at this time.    Diet:  Healthy heart Access:  PIV IVF:  None Proph:  lovenox   Code Status: DNR Family Communication: spoke with patient alone Disposition Plan: pending further improvement in lower extremity edema and pain. PT/OT recommending SNF   Consultants:  None  Procedures:  ECHO  CTa  Duplex lower extremity  Antibiotics:  vanc  cipro   HPI/Subjective:  Patient continues to have left lower extremity pain that was severe when trying to work with PT today.  Denies CP, SOB, N/V/D.  + constipation.  Endorses depressive like-symptoms.  Was very confused  about where he was earlier today when he woke up from a nap.  He imagined he was back at [facility], but he realized that his surroundings were not the same.  It took him a long time to remember where he was.  Denies seeing bugs, hearing voices.    Objective: Filed Vitals:   01/20/12 2230 01/21/12 0700 01/21/12 0912 01/21/12 1418  BP: 122/43 145/67 126/44 111/43  Pulse: 66 65 70 61  Temp: 98.5 F (36.9 C) 97.8 F (36.6 C)  97.7 F (36.5 C)  TempSrc: Oral Oral  Oral  Resp: 20 20  20   Height:      Weight:      SpO2: 94% 94%  93%    Intake/Output Summary (Last 24 hours) at 01/21/12 2025 Last data filed at 01/21/12 1840  Gross per 24 hour  Intake    600 ml  Output   1750 ml  Net  -1150 ml   Filed Weights   01/19/12 2250 01/20/12 0737  Weight: 58.514 kg (129 lb) 56.972 kg (125 lb 9.6 oz)    Exam:   General:  [name], no acute distress, thin  HEENT:  MMM, NCAT  Cardiovascular:  RRR, no m/r/g, 1+ pedal pulses, normal S1, S2  Respiratory:  CTAB, no increased WOB  Abdomen:  NABS, soft, nondistended, nontender, no organomegaly  MSK:  2+ BLE pitting edema  Neuro:  Grossly intact  Skin:  Purple red hue to the left shin area with bright erythema of the foot  extending to below the knee.  Increased swelling today and warm to touch.  Data Reviewed: Basic Metabolic Panel:  Lab 01/20/12 1610 01/20/12 01/19/12 2359 01/19/12 1800  NA 141 137 -- 144  K 4.2 4.7 -- 4.5  CL 105 101 -- 105  CO2 29 30 -- 31  GLUCOSE 90 113* -- 96  BUN 30* 29* -- 37*  CREATININE 1.06 1.26 1.11 1.21  CALCIUM 8.9 8.6 -- 9.4  MG -- -- -- --  PHOS -- -- -- --   Liver Function Tests:  Lab 01/19/12 1800  AST 26  ALT 25  ALKPHOS 62  BILITOT 0.8  PROT 6.5  ALBUMIN 3.1*   No results found for this basename: LIPASE:5,AMYLASE:5 in the last 168 hours No results found for this basename: AMMONIA:5 in the last 168 hours CBC:  Lab 01/20/12 0504 01/20/12 01/19/12 2359 01/19/12 1800    WBC 6.7 6.4 6.9 8.1  NEUTROABS -- -- -- --  HGB 11.2* 11.0* 11.1* 12.3*  HCT 33.6* 32.6* 32.2* 35.9*  MCV 94.6 95.3 94.7 94.7  PLT 149* 146* 144* 178   Cardiac Enzymes:  Lab 01/19/12 2359  CKTOTAL --  CKMB --  CKMBINDEX --  TROPONINI <0.30   BNP (last 3 results)  Basename 01/19/12 1800  PROBNP 1245.0*   CBG: No results found for this basename: GLUCAP:5 in the last 168 hours  No results found for this or any previous visit (from the past 240 hour(s)).   Studies: Ct Angio Chest Pe W/cm &/or Wo Cm  01/20/2012  *RADIOLOGY REPORT*  Clinical Data: Shortness of breath and elevated D-dimer.  Question pulmonary embolus.  CT ANGIOGRAPHY CHEST  Technique:  Multidetector CT imaging of the chest using the standard protocol during bolus administration of intravenous contrast. Multiplanar reconstructed images including MIPs were obtained and reviewed to evaluate the vascular anatomy.  Contrast: OMNIPAQUE IOHEXOL 350 MG/ML SOLN  Comparison: 08/26/2009.  Findings: Respiratory motion degrades image quality in the lung bases, limiting evaluation for segmental and subsegmental pulmonary emboli.  Otherwise, no pulmonary embolus.  No pathologically enlarged mediastinal, hilar or axillary lymph nodes.  The esophagus is mildly dilated and fluid-filled.  Atherosclerotic calcification of the arterial vasculature.  Heart size within normal limits.  No pericardial effusion.  Calcified pleural plaques in the right hemithorax.  Scarring in the right lower lobe.  Tiny amount of loculated pleural fluid and adjacent volume loss along an elevated left hemidiaphragm, new.  No pleural fluid.  Airway is unremarkable.  Incidental imaging of the upper abdomen shows low attenuation lesions in the kidneys measuring up to 5.1 cm. No worrisome lytic or sclerotic lesions.  IMPRESSION:  1.  Respiratory motion limits evaluation for segmental and subsegmental pulmonary emboli at the lung bases.  Otherwise, no pulmonary embolus.  2.  Small amount of loculated pleural fluid and associated volume loss adjacent to an elevated left hemidiaphragm.   Original Report Authenticated By: Leanna Battles, M.D.     Scheduled Meds:    . aspirin  325 mg Oral Daily  . atorvastatin  20 mg Oral q1800  . cholecalciferol  2,000 Units Oral Daily  . ciprofloxacin  400 mg Intravenous BID  . docusate sodium  100 mg Oral BID  . enoxaparin (LOVENOX) injection  40 mg Subcutaneous Q24H  . furosemide  20 mg Oral Daily  . loratadine  10 mg Oral Daily  . metoprolol succinate  12.5 mg Oral Daily  . omega-3 acid ethyl esters  1 g Oral Daily  .  polyethylene glycol  17 g Oral Daily  . pyridOXINE  50 mg Oral BID  . sodium chloride  3 mL Intravenous Q12H  . vancomycin  750 mg Intravenous QHS   Continuous Infusions:   Principal Problem:  *Lower extremity edema Active Problems:  HYPOTENSION  CONSTIPATION  Coronary artery disease  Cellulitis    Time spent: 30 min    Aidan Caloca  [facility] Pager [phone]. If 8PM-8AM, please contact night-coverage at [email or web address], password [facility] 01/21/2012, 8:25 PM  LOS: 2 days

## 2012-01-21 NOTE — Evaluation (Signed)
Occupational Therapy Evaluation Patient Details Name: [name] MRN: [id number] DOB: [birth year] Today's Date: 01/21/2012 Time: 4098-1191 OT Time Calculation (min): 27 min  OT Assessment / Plan / Recommendation Clinical Impression  79 yo male who presents with severe LE edema. Pt lives alone and does not have 24/7 A. Skilled OT indicated to maximize independence with BADLs to min-mod A level in prep for d.c to next venue of care.    OT Assessment  Patient needs continued OT Services    Follow Up Recommendations  SNF;Supervision/Assistance - 24 hour    Barriers to Discharge Decreased caregiver support    Equipment Recommendations  3 in 1 bedside comode    Recommendations for Other Services    Frequency  Min 2X/week    Precautions / Restrictions Precautions Precautions: Fall Precaution Comments: LLE very red and edemetous   Pertinent Vitals/Pain Pt reported severe LLE pain with mobility which he did not rate. Repositioned and elevated.    ADL  Grooming: Set up Where Assessed - Grooming: Unsupported sitting Upper Body Bathing: Minimal assistance Where Assessed - Upper Body Bathing: Unsupported sitting Lower Body Bathing: +2 Total assistance Lower Body Bathing: Patient Percentage: 0% Where Assessed - Lower Body Bathing: Supported sit to stand Upper Body Dressing: Minimal assistance Where Assessed - Upper Body Dressing: Unsupported sitting Lower Body Dressing: +2 Total assistance Lower Body Dressing: Patient Percentage: 0% Where Assessed - Lower Body Dressing: Supported sit to stand Toileting - Architect and Hygiene: +2 Total assistance Toileting - Clothing Manipulation and Hygiene: Patient Percentage: 0% Equipment Used: Rolling walker ADL Comments: Pt able to stand but unable to take any pivotal steps to chair 2* LLE pain. Pt presents with a severe posterior lean. Pt with +2 pitting edema in LLE.    OT Diagnosis: Generalized weakness  OT Problem List:  Decreased activity tolerance;Increased edema;Decreased knowledge of use of DME or AE;Pain OT Treatment Interventions: Self-care/ADL training;Therapeutic activities;DME and/or AE instruction;Patient/family education   OT Goals Acute Rehab OT Goals OT Goal Formulation: With patient Time For Goal Achievement: 02/04/12 Potential to Achieve Goals: Good ADL Goals Pt Will Perform Grooming: with set-up;Sitting, chair;Sitting, edge of bed;Unsupported ADL Goal: Grooming - Progress: Goal set today Pt Will Transfer to Toilet: with mod assist;Ambulation;with DME;Stand pivot transfer ADL Goal: Toilet Transfer - Progress: Goal set today Additional ADL Goal #1: Pt will complete supine<>sit with min-mod A as a precurser for ADLs. ADL Goal: Additional Goal #1 - Progress: Goal set today Additional ADL Goal #2: Pt will tolerate 2 min of static standing with min A in prep for standing ADLs. ADL Goal: Additional Goal #2 - Progress: Goal set today  Visit Information  Last OT Received On: 01/21/12 Assistance Needed: +2 PT/OT Co-Evaluation/Treatment: Yes    Subjective Data  Subjective: Where's the doctor?  Patient Stated Goal: Not asked.   Prior Functioning     Home Living Lives With: Alone Type of Home: Independent living facility Home Access: Level entry Home Layout: One level Bathroom Shower/Tub: Heritage manager Accessibility: Yes Home Adaptive Equipment: Grab bars in shower;Grab bars around toilet;Walker - Proofreader Additional Comments: transfers to Journalist, newspaper. Prior Function Level of Independence: Independent with assistive device(s) Driving: No Vocation: Retired Comments:   Musician: No difficulties Dominant Hand: Right         Vision/Perception     Cognition  Overall Cognitive Status: Appears within functional limits for tasks assessed/performed Arousal/Alertness: Awake/alert Orientation Level: Appears intact for tasks  assessed Behavior During Session: [facility] for  tasks performed    Extremity/Trunk Assessment Right Upper Extremity Assessment RUE ROM/Strength/Tone: [facility] for tasks assessed Left Upper Extremity Assessment LUE ROM/Strength/Tone: [facility] for tasks assessed     Mobility Bed Mobility Bed Mobility: Supine to Sit;Sit to Supine Supine to Sit: 1: +2 Total assist;HOB elevated;With rails Supine to Sit: Patient Percentage: 10% Sit to Supine: 1: +2 Total assist;HOB flat;With rail Details for Bed Mobility Assistance: Extensive physical A needed for trunk and LEs. Pt unable to self assist at all for back to bed. Transfers Transfers: Sit to Stand;Stand to Sit Sit to Stand: 1: +2 Total assist;With upper extremity assist;From bed;From elevated surface Sit to Stand: Patient Percentage: 40% Stand to Sit: 1: +2 Total assist;To bed;With upper extremity assist Stand to Sit: Patient Percentage: 40% Details for Transfer Assistance: Physcial A needed to rise, stabilize self and control descent to chair. Pt unable to bear much weight through LLE.     Shoulder Instructions     Exercise     Balance Static Sitting Balance Static Sitting - Balance Support: Bilateral upper extremity supported;Feet supported Static Sitting - Level of Assistance: 5: Stand by assistance   End of Session OT - End of Session Activity Tolerance: Patient limited by pain Patient left: in bed;with call bell/phone within reach  GO     Valicia Rice A OTR/[id number] 01/21/2012, 2:04 PM

## 2012-01-21 NOTE — Evaluation (Signed)
Physical Therapy Evaluation Patient Details Name: [name] MRN: [id number] DOB: [birth year] Today's Date: 01/21/2012 Time: 4098-1191 PT Time Calculation (min): 26 min  PT Assessment / Plan / Recommendation Clinical Impression  Pt. was admitted .01/19/12 with onset of LLE edema and pain. Pt. could not tolerate attempts to stand with any weight on LLE. Pt. had increased pain. Pt. lives alone, was using a motor WC/scooter PTA, limited ambulation . Pt. was independent per pt. Pt. will benefit from PT while in acute care. May need post acute rehab at DC.    PT Assessment  Patient needs continued PT services    Follow Up Recommendations  SNF    Does the patient have the potential to tolerate intense rehabilitation      Barriers to Discharge Decreased caregiver support      Equipment Recommendations  None recommended by PT    Recommendations for Other Services     Frequency Min 3X/week    Precautions / Restrictions Precautions Precautions: Fall Precaution Comments: LLE very red and edemetous    Pertinent Vitals/Pain States L lower leg throbs, repositioned and elevated.      Mobility  Bed Mobility Bed Mobility: Supine to Sit;Sit to Supine Supine to Sit: 1: +2 Total assist;HOB elevated;With rails Supine to Sit: Patient Percentage: 10% Sit to Supine: 1: +2 Total assist;HOB flat;With rail Details for Bed Mobility Assistance: Extensive physical A needed for trunk and LEs. Pt unable to self assist at all for back to bed. Transfers Sit to Stand: 1: +2 Total assist;With upper extremity assist;From bed;From elevated surface Sit to Stand: Patient Percentage: 40% Stand to Sit: 1: +2 Total assist;To bed;With upper extremity assist Stand to Sit: Patient Percentage: 40% Details for Transfer Assistance: Physcial A needed to rise, stabilize self and control descent to chair. Pt unable to bear much weight through LLE.    Shoulder Instructions     Exercises     PT Diagnosis:  Difficulty walking;Acute pain;Generalized weakness  PT Problem List: Decreased strength;Decreased range of motion;Decreased activity tolerance;Decreased mobility;Decreased safety awareness;Decreased knowledge of precautions;Pain;Decreased knowledge of use of DME PT Treatment Interventions: DME instruction;Gait training;Functional mobility training;Therapeutic activities;Therapeutic exercise;Patient/family education   PT Goals Acute Rehab PT Goals PT Goal Formulation: With patient Time For Goal Achievement: 02/04/12 Potential to Achieve Goals: Good Pt will go Supine/Side to Sit: with supervision PT Goal: Supine/Side to Sit - Progress: Goal set today Pt will go Sit to Supine/Side: with supervision PT Goal: Sit to Supine/Side - Progress: Goal set today Pt will go Sit to Stand: with min assist Pt will go Stand to Sit: with supervision PT Goal: Stand to Sit - Progress: Goal set today Pt will Transfer Bed to Chair/Chair to Bed: with min assist Pt will Ambulate: 1 - 15 feet;with min assist;with rolling walker PT Goal: Ambulate - Progress: Goal set today  Visit Information  Last PT Received On: 01/21/12 Assistance Needed: +2 PT/OT Co-Evaluation/Treatment: Yes    Subjective Data  Subjective: My leg hurts so bad. It is pulsing. Patient Stated Goal: to return to his apt.   Prior Functioning  Home Living Lives With: Alone Type of Home: Independent living facility Home Access: Level entry Home Layout: One level Bathroom Shower/Tub: Heritage manager Accessibility: Yes Home Adaptive Equipment: Grab bars in shower;Grab bars around toilet;Walker - Proofreader Additional Comments: transfers to Journalist, newspaper. Prior Function Level of Independence: Independent with assistive device(s) Driving: No Vocation: Retired Comments:   Communication Communication: No difficulties Dominant Hand: Right  Cognition  Overall Cognitive Status: Appears within functional  limits for tasks assessed/performed Arousal/Alertness: Awake/alert Orientation Level: Appears intact for tasks assessed Behavior During Session: [facility] for tasks performed    Extremity/Trunk Assessment Right Upper Extremity Assessment RUE ROM/Strength/Tone: [facility] for tasks assessed Left Upper Extremity Assessment LUE ROM/Strength/Tone: [facility] for tasks assessed Right Lower Extremity Assessment RLE ROM/Strength/Tone: Deficits RLE ROM/Strength/Tone Deficits: grossly 4/5 for standing. Left Lower Extremity Assessment LLE ROM/Strength/Tone: Deficits LLE ROM/Strength/Tone Deficits: unable to tolerate weight, unable to lift from bed. LLE Sensation: Deficits   Balance Static Sitting Balance Static Sitting - Balance Support: Bilateral upper extremity supported;Feet supported Static Sitting - Level of Assistance: 5: Stand by assistance  End of Session PT - End of Session Activity Tolerance: Patient limited by pain Patient left: in bed;with call bell/phone within reach Nurse Communication: Mobility status  GP     Rada Hay 01/21/2012, 2:29 PM  [phone]

## 2012-01-22 LAB — HEPATIC FUNCTION PANEL
ALT: 17 U/L (ref 0–53)
Albumin: 2.4 g/dL — ABNORMAL LOW (ref 3.5–5.2)
Alkaline Phosphatase: 56 U/L (ref 39–117)
Indirect Bilirubin: 0.5 mg/dL (ref 0.3–0.9)
Total Protein: 5.6 g/dL — ABNORMAL LOW (ref 6.0–8.3)

## 2012-01-22 LAB — BASIC METABOLIC PANEL
CO2: 27 mEq/L (ref 19–32)
Chloride: 101 mEq/L (ref 96–112)
Creatinine, Ser: 1.09 mg/dL (ref 0.50–1.35)

## 2012-01-22 LAB — URINALYSIS, ROUTINE W REFLEX MICROSCOPIC
Glucose, UA: NEGATIVE mg/dL
Ketones, ur: NEGATIVE mg/dL
pH: 7.5 (ref 5.0–8.0)

## 2012-01-22 MED ORDER — ENSURE PUDDING PO PUDG
1.0000 | Freq: Three times a day (TID) | ORAL | Status: DC
  Administered 2012-01-22 – 2012-01-25 (×7): 1 via ORAL
  Filled 2012-01-22 (×10): qty 1

## 2012-01-22 NOTE — Progress Notes (Signed)
Clinical Social Work Department BRIEF PSYCHOSOCIAL ASSESSMENT 01/22/2012  Patient:  [name], [name]     Account Number:  [id number]     Admit date:  01/19/2012  Clinical Social Worker:  Orpah Greek  Date/Time:  01/22/2012 12:18 PM  Referred by:  Physician  Date Referred:  01/22/2012 Referred for  SNF Placement   Other Referral:   Interview type:  Patient Other interview type:    PSYCHOSOCIAL DATA Living Status:  FACILITY [facility] Level of care:  Independent Living Primary support name:  Billey Gosling (friend) ph#: [phone] Primary support relationship to patient:  FRIEND Degree of support available:   good    CURRENT CONCERNS Current Concerns  Post-Acute Placement   Other Concerns:    SOCIAL WORK ASSESSMENT / PLAN CSW spoke with patient re: discharge planning. Patient was admitted from [facility]. PT recommended SNF at discharge.   Assessment/plan status:  Information/Referral to [facility] Other assessment/ plan:   Information/referral to community resources:   CSW completed FL2 and faxed information to [facility]. Confirmed with Janie @ [facility] that they did not have a bed available for patient but [facility] does have a bed available.    PATIENT'S/FAMILY'S RESPONSE TO PLAN OF CARE: Patient is agreeable with plan to go to SNF at [facility] at discharge. Patient has a bed available today if medically ready.        Unice Bailey, LCSW [facility] Clinical Social Worker cell #: [phone]

## 2012-01-22 NOTE — Progress Notes (Signed)
Patient is agreeable with plan to go to SNF at [facility] at discharge. Patient has a bed available today if medically ready.    Clinical Social Work Department CLINICAL SOCIAL WORK PLACEMENT NOTE 01/22/2012  Patient:  [name], [name]  Account Number:  [id number] Admit date:  01/19/2012  Clinical Social Worker:  Orpah Greek  Date/time:  01/22/2012 12:22 PM  Clinical Social Work is seeking post-discharge placement for this patient at the following level of care:   SKILLED NURSING   (*CSW will update this form in Epic as items are completed)   01/22/2012  Patient/family provided with [facility] Department of Clinical Social Work's list of facilities offering this level of care within the geographic area requested by the patient (or if unable, by the patient's family).  01/22/2012  Patient/family informed of their freedom to choose among providers that offer the needed level of care, that participate in Medicare, Medicaid or managed care program needed by the patient, have an available bed and are willing to accept the patient.  01/22/2012  Patient/family informed of [facility]' ownership interest in [facility], as well as of the fact that they are under no obligation to receive care at this facility.  PASARR submitted to EDS on 01/22/2012 PASARR number received from EDS on 01/22/2012  FL2 transmitted to all facilities in geographic area requested by pt/family on  01/22/2012 FL2 transmitted to all facilities within larger geographic area on   Patient informed that his/her managed care company has contracts with or will negotiate with  certain facilities, including the following:     Patient/family informed of bed offers received:  01/22/2012 Patient chooses bed at [facility] AT [facility] Physician recommends and patient chooses bed at    Patient to be transferred to [facility] AT [facility] on   Patient to be transferred to facility by   The following  physician request were entered in Epic:   Additional Comments:  Unice Bailey, LCSW [facility] Clinical Social Worker cell #: [phone]

## 2012-01-22 NOTE — Progress Notes (Addendum)
[facility] PROGRESS NOTE  [name] ZOX:[id number] DOB: 1916-04-14 DOA: 01/19/2012 PCP: Michele Mcalpine, MD  Assessment/Plan  #1. Lower extremity edema - most likely venous stasis dermatitis with cellulitis, finally starting to improve today -  Duplex negative -  Continue vanc and cipro day 3 -  Venous stasis TED hose when able and elevate extremity -  ECHO demonstrates preserved EF and no mention of diastolic dysfunction.  PA pressure was mildly increased at -  UA to check for proteinuria -  Ultram and tylenol for pain.   -  Hypoalbuminemia likely contributing to anemia, but no nephrotic syndrome or evidence of cirrhosis  Delirium:  May be due to pain, pain medication, acute infections. -  Minimize sedating medications  -  Orient with lights on during day and off at night -  Up in chair during day -  Haldol prn  #2. Hypotension, resolving but blood pressures still low normal.  Patient does not look septic.  -  Holding antihypertensives.  -  Continue Lasix to 20 by mouth. #3. CHF last EF preserved 65%.  See above. #4. Hypertension - normotensive currently off BP mediations - see above  #5. CAD status post CABG -  Presently chest pain-free. EKG shows RBBB.  Troponin neg.  #6. Hyperlipidemia - continue present medications.  Constipation:  Add Colace, senna, miralax Protein calorie malnutrition: Nutrition consult, supplements, liberalize diet.  Diet:  Healthy heart Access:  PIV IVF:  None Proph:  lovenox   Code Status: DNR Family Communication: spoke with patient alone Disposition Plan: pending further improvement in lower extremity edema and pain. PT/OT recommending SNF   Consultants:  None  Procedures:  ECHO  CTa  Duplex lower extremity  Antibiotics:  vanc  cipro   HPI/Subjective:  Denies CP, SOB, N/V/D.  + constipation.   Continues to have confusion about where he is.  Has pain in the left extremity, but it is improved  today  Objective: Filed Vitals:   01/22/12 0700 01/22/12 1012 01/22/12 1402 01/22/12 2120  BP:  119/53 94/43 128/65  Pulse:  72 62 72  Temp:   98.3 F (36.8 C) 97.8 F (36.6 C)  TempSrc:   Oral Oral  Resp:   18 18  Height:      Weight: 57 kg (125 lb 10.6 oz)     SpO2:   94% 96%    Intake/Output Summary (Last 24 hours) at 01/22/12 2129 Last data filed at 01/22/12 1402  Gross per 24 hour  Intake   1660 ml  Output   2300 ml  Net   -640 ml   Filed Weights   01/19/12 2250 01/20/12 0737 01/22/12 0700  Weight: 58.514 kg (129 lb) 56.972 kg (125 lb 9.6 oz) 57 kg (125 lb 10.6 oz)    Exam:   General:  [name], no acute distress, thin  HEENT:  MMM, NCAT  Cardiovascular:  RRR, no m/r/g, 1+ pedal pulses, normal S1, S2  Respiratory:  CTAB, no increased WOB  Abdomen:  NABS, soft, nondistended, nontender, no organomegaly  MSK:  2+ BLE pitting edema  Neuro:  Grossly intact  Skin:  Purple red hue to the left shin area with bright erythema of the foot extending to below the knee.  Decreased swelling and warmth today.  Data Reviewed: Basic Metabolic Panel:  Lab 01/22/12 8119 01/20/12 0504 01/20/12 01/19/12 2359 01/19/12 1800  NA 134* 141 137 -- 144  K 4.2 4.2 4.7 -- 4.5  CL 101 105 101 --  105  CO2 27 29 30  -- 31  GLUCOSE 90 90 113* -- 96  BUN 23 30* 29* -- 37*  CREATININE 1.09 1.06 1.26 1.11 1.21  CALCIUM 8.5 8.9 8.6 -- 9.4  MG -- -- -- -- --  PHOS -- -- -- -- --   Liver Function Tests:  Lab 01/22/12 0445 01/19/12 1800  AST 22 26  ALT 17 25  ALKPHOS 56 62  BILITOT 0.7 0.8  PROT 5.6* 6.5  ALBUMIN 2.4* 3.1*   No results found for this basename: LIPASE:5,AMYLASE:5 in the last 168 hours No results found for this basename: AMMONIA:5 in the last 168 hours CBC:  Lab 01/20/12 0504 01/20/12 01/19/12 2359 01/19/12 1800  WBC 6.7 6.4 6.9 8.1  NEUTROABS -- -- -- --  HGB 11.2* 11.0* 11.1* 12.3*  HCT 33.6* 32.6* 32.2* 35.9*  MCV 94.6 95.3 94.7 94.7  PLT 149* 146*  144* 178   Cardiac Enzymes:  Lab 01/19/12 2359  CKTOTAL --  CKMB --  CKMBINDEX --  TROPONINI <0.30   BNP (last 3 results)  Basename 01/19/12 1800  PROBNP 1245.0*   CBG: No results found for this basename: GLUCAP:5 in the last 168 hours  No results found for this or any previous visit (from the past 240 hour(s)).   Studies: No results found.  Scheduled Meds:    . aspirin  325 mg Oral Daily  . atorvastatin  20 mg Oral q1800  . cholecalciferol  2,000 Units Oral Daily  . ciprofloxacin  400 mg Intravenous BID  . docusate sodium  100 mg Oral BID  . enoxaparin (LOVENOX) injection  40 mg Subcutaneous Q24H  . furosemide  20 mg Oral Daily  . loratadine  10 mg Oral Daily  . metoprolol succinate  12.5 mg Oral Daily  . omega-3 acid ethyl esters  1 g Oral Daily  . polyethylene glycol  17 g Oral Daily  . pyridOXINE  50 mg Oral BID  . sodium chloride  3 mL Intravenous Q12H  . vancomycin  750 mg Intravenous QHS   Continuous Infusions:   Principal Problem:  *Lower extremity edema Active Problems:  HYPOTENSION  CONSTIPATION  Coronary artery disease  Cellulitis    Time spent: 30 min    [name]  [facility] Pager [phone]. If 7PM-7AM, please contact night-coverage at [email or web address], password [facility] 01/22/2012, 9:28 PM  LOS: 3 days

## 2012-01-22 NOTE — Progress Notes (Signed)
ANTIBIOTIC CONSULT NOTE - FOLLOW UP  Pharmacy Consult for Vancomycin and Ciprofloxacin Indication: Cellulitis  No Known Allergies  Patient Measurements: Height: 5\' 6"  (167.6 cm) Weight: 125 lb 10.6 oz (57 kg) IBW/kg (Calculated) : 63.8  Adjusted Body Weight:   Vital Signs: Temp: 98 F (36.7 C) (01/17 0500) Temp src: Oral (01/17 0500) BP: 119/53 mmHg (01/17 1012) Pulse Rate: 72  (01/17 1012) Intake/Output from previous day: 01/16 0701 - 01/17 0700 In: 1820 [P.O.:720; IV Piggyback:1100] Out: 2525 [Urine:2525] Intake/Output from this shift: Total I/O In: 240 [P.O.:240] Out: -   Labs:  Basename 01/22/12 0445 01/20/12 0504 01/20/12 01/19/12 2359  WBC -- 6.7 6.4 6.9  HGB -- 11.2* 11.0* 11.1*  PLT -- 149* 146* 144*  LABCREA -- -- -- --  CREATININE 1.09 1.06 1.26 --   Estimated Creatinine Clearance: 34.1 ml/min (by C-G formula based on Cr of 1.09). No results found for this basename: VANCOTROUGH:2,VANCOPEAK:2,VANCORANDOM:2,GENTTROUGH:2,GENTPEAK:2,GENTRANDOM:2,TOBRATROUGH:2,TOBRAPEAK:2,TOBRARND:2,AMIKACINPEAK:2,AMIKACINTROU:2,AMIKACIN:2, in the last 72 hours   Microbiology: No results found for this or any previous visit (from the past 720 hour(s)).  Anti-infectives     Start     Dose/Rate Route Frequency Ordered Stop   01/20/12 2200   vancomycin (VANCOCIN) 750 mg in sodium chloride 0.9 % 150 mL IVPB        750 mg 150 mL/hr over 60 Minutes Intravenous Daily at bedtime 01/20/12 0137     01/20/12 1000   ciprofloxacin (CIPRO) IVPB 400 mg        400 mg 200 mL/hr over 60 Minutes Intravenous 2 times daily 01/20/12 0137     01/19/12 2330   ciprofloxacin (CIPRO) IVPB 400 mg        400 mg 200 mL/hr over 60 Minutes Intravenous  Once 01/19/12 2321 01/20/12 0145   01/19/12 2015   vancomycin (VANCOCIN) IVPB 1000 mg/200 mL premix        1,000 mg 200 mL/hr over 60 Minutes Intravenous  Once 01/19/12 2008 01/19/12 2207          Assessment: [name] on D#3 IV Vancomycin and  ciprofloxacin for cellulitis.    Afebrile  WBC WNL (1/15)  Renal fxn stable - CrCl ~ 34 (N43), which is near borderline (<30) for adjusting ciprofloxacin.   No cultures   Goal of Therapy:  Vancomycin trough level 10-15 mcg/ml  Plan:  1.  Continue Vancomcyin and ciprofloxacin as ordered.  2.  F/u VT at steady state 3.  F/u renal fxn, T, WBC, clinical course  Haynes Hoehn, PharmD 01/22/2012 1:53 PM  Pager: [phone]

## 2012-01-23 DIAGNOSIS — R410 Disorientation, unspecified: Secondary | ICD-10-CM

## 2012-01-23 MED ORDER — BISACODYL 10 MG RE SUPP: 10.0000 mg | Freq: Every day | RECTAL | Status: DC | PRN

## 2012-01-23 MED ORDER — MAGNESIUM CITRATE PO SOLN
1.0000 | Freq: Once | ORAL | Status: AC
  Administered 2012-01-23: 1 via ORAL

## 2012-01-23 NOTE — Progress Notes (Signed)
[facility] PROGRESS NOTE  [name] ZOX:[id number] DOB: 1916-12-26 DOA: 01/19/2012 PCP: Randy Mcalpine, MD  Assessment/Plan  #1. Lower extremity edema - most likely venous stasis dermatitis with cellulitis, improved today -  Duplex negative -  Continue vanc and cipro day 4 -  TED hose  -  ECHO demonstrates preserved EF and no mention of diastolic dysfunction.  PA pressure was mildly increased at -  UA without proteinuria -  Ultram and tylenol for pain.   -  Hypoalbuminemia likely contributing to anemia, but no nephrotic syndrome or evidence of cirrhosis, likely nutritional  Delirium:  May be due to pain, pain medication, acute infections. -  Minimize sedating medications  -  Orient with lights on during day and off at night -  Up in chair during day -  Haldol prn  #2. Hypotension, resolved.  Patient does not look septic.  -  Holding antihypertensives.  -  Continue Lasix to 20 by mouth. #3. CHF last EF preserved 65%.  See above. #4. Hypertension - normotensive currently off BP mediations - see above  #5. CAD status post CABG -  Presently chest pain-free. EKG shows RBBB.  Troponin neg.  #6. Hyperlipidemia - continue present medications.   Constipation:  Add Colace, senna, miralax -  Magnesium citrate and bisacodyl suppository   Protein calorie malnutrition:  -  Nutrition consult, supplements, liberalize diet.  Diet:  Healthy heart Access:  PIV IVF:  None Proph:  lovenox   Code Status: DNR Family Communication: spoke with patient alone Disposition Plan: pending further improvement in lower extremity edema and pain. PT/OT recommending SNF   Consultants:  None  Procedures:  ECHO  CTa  Duplex lower extremity  Antibiotics:  vanc  cipro   HPI/Subjective:  Denies CP, SOB, N/V/D.  Perseverates on constipation.   Continues to have confusion about where he is.  Has pain in the left extremity, but it is improved today  Objective: Filed  Vitals:   01/22/12 2120 01/23/12 0558 01/23/12 0700 01/23/12 1500  BP: 128/65 125/45  107/47  Pulse: 72 80  87  Temp: 97.8 F (36.6 C) 97.4 F (36.3 C)  97.6 F (36.4 C)  TempSrc: Oral Oral  Oral  Resp: 18 18  20   Height:      Weight:   56.2 kg (123 lb 14.4 oz)   SpO2: 96% 94%  97%    Intake/Output Summary (Last 24 hours) at 01/23/12 1944 Last data filed at 01/23/12 1743  Gross per 24 hour  Intake    930 ml  Output   1700 ml  Net   -770 ml   Filed Weights   01/20/12 0737 01/22/12 0700 01/23/12 0700  Weight: 56.972 kg (125 lb 9.6 oz) 57 kg (125 lb 10.6 oz) 56.2 kg (123 lb 14.4 oz)    Exam:   General:  [name], no acute distress, thin  HEENT:  MMM, NCAT  Cardiovascular:  RRR, no m/r/g, 1+ pedal pulses, normal S1, S2  Respiratory:  CTAB, no increased WOB  Abdomen:  NABS, soft, nondistended, nontender, no organomegaly  MSK:  2+ BLE pitting edema  Neuro:  Grossly intact  Skin:  TED hose in place.  Purple red hue to the left shin area with less erythema of the foot but still extending up to below the knee.  Decreasing swelling and warmth.  Data Reviewed: Basic Metabolic Panel:  Lab 01/22/12 8119 01/20/12 0504 01/20/12 01/19/12 2359 01/19/12 1800  NA 134* 141 137 -- 144  K 4.2 4.2 4.7 -- 4.5  CL 101 105 101 -- 105  CO2 27 29 30  -- 31  GLUCOSE 90 90 113* -- 96  BUN 23 30* 29* -- 37*  CREATININE 1.09 1.06 1.26 1.11 1.21  CALCIUM 8.5 8.9 8.6 -- 9.4  MG -- -- -- -- --  PHOS -- -- -- -- --   Liver Function Tests:  Lab 01/22/12 0445 01/19/12 1800  AST 22 26  ALT 17 25  ALKPHOS 56 62  BILITOT 0.7 0.8  PROT 5.6* 6.5  ALBUMIN 2.4* 3.1*   No results found for this basename: LIPASE:5,AMYLASE:5 in the last 168 hours No results found for this basename: AMMONIA:5 in the last 168 hours CBC:  Lab 01/20/12 0504 01/20/12 01/19/12 2359 01/19/12 1800  WBC 6.7 6.4 6.9 8.1  NEUTROABS -- -- -- --  HGB 11.2* 11.0* 11.1* 12.3*  HCT 33.6* 32.6* 32.2* 35.9*  MCV  94.6 95.3 94.7 94.7  PLT 149* 146* 144* 178   Cardiac Enzymes:  Lab 01/19/12 2359  CKTOTAL --  CKMB --  CKMBINDEX --  TROPONINI <0.30   BNP (last 3 results)  Basename 01/19/12 1800  PROBNP 1245.0*   CBG: No results found for this basename: GLUCAP:5 in the last 168 hours  No results found for this or any previous visit (from the past 240 hour(s)).   Studies: No results found.  Scheduled Meds:    . aspirin  325 mg Oral Daily  . atorvastatin  20 mg Oral q1800  . cholecalciferol  2,000 Units Oral Daily  . ciprofloxacin  400 mg Intravenous BID  . docusate sodium  100 mg Oral BID  . enoxaparin (LOVENOX) injection  40 mg Subcutaneous Q24H  . feeding supplement  1 Container Oral TID BM  . furosemide  20 mg Oral Daily  . loratadine  10 mg Oral Daily  . metoprolol succinate  12.5 mg Oral Daily  . omega-3 acid ethyl esters  1 g Oral Daily  . polyethylene glycol  17 g Oral Daily  . pyridOXINE  50 mg Oral BID  . sodium chloride  3 mL Intravenous Q12H  . vancomycin  750 mg Intravenous QHS   Continuous Infusions:   Principal Problem:  *Lower extremity edema Active Problems:  HYPOTENSION  CONSTIPATION  Coronary artery disease  Cellulitis    Time spent: 30 min    [name]  [facility] Pager [phone]. If 7PM-7AM, please contact night-coverage at [email or web address], password [facility] 01/23/2012, 7:44 PM  LOS: 4 days

## 2012-01-24 DIAGNOSIS — I498 Other specified cardiac arrhythmias: Secondary | ICD-10-CM

## 2012-01-24 LAB — VANCOMYCIN, TROUGH: Vancomycin Tr: 9.3 ug/mL — ABNORMAL LOW (ref 10.0–20.0)

## 2012-01-24 MED ORDER — SODIUM CHLORIDE 0.9 % IV SOLN
INTRAVENOUS | Status: DC
  Administered 2012-01-24 – 2012-01-25 (×2): via INTRAVENOUS

## 2012-01-24 NOTE — Progress Notes (Signed)
[facility] PROGRESS NOTE  [name] ZOX:[id number] DOB: [birth year] DOA: 01/19/2012 PCP: Michele Mcalpine, MD  Assessment/Plan  #1. Lower extremity edema - most likely venous stasis dermatitis with cellulitis, improved again today -  Duplex negative -  Continue vanc and cipro day 5 -  TED hose  -  ECHO demonstrates preserved EF and no mention of diastolic dysfunction.  PA pressure was mildly increased at -  UA without proteinuria -  Ultram and tylenol for pain.   -  Hypoalbuminemia likely contributing to anemia, but no nephrotic syndrome or evidence of cirrhosis, likely nutritional  Delirium:  May be due to pain, pain medication, acute infections and improved today -  Minimize sedating medications  -  Orient with lights on during day and off at night -  Up in chair during day -  Haldol prn  #2. Hypotension, recurrent.  Patient appears dry today on exam.  Already on broad spectrum antibiotics  -  Hold antihypertensives.  -  D/c Lasix -  Gentle IVF  #3. CHF last EF preserved 65%.  See above. #4. Hypertension - normotensive currently off BP mediations - see above  #5. CAD status post CABG -  Presently chest pain-free. EKG shows RBBB.  Troponin neg.  #6. Hyperlipidemia - continue present medications.   Constipation:  Add Colace, senna, miralax -  Magnesium citrate and bisacodyl suppository   Protein calorie malnutrition:  -  Nutrition consult, supplements, liberalize diet.  Diet:  regular Access:  PIV IVF:  NS at 11ml/h x 10h Proph:  lovenox   Code Status: DNR Family Communication: spoke with patient alone Disposition Plan: pending further improvement in lower extremity edema and pain. PT/OT recommending SNF   Consultants:  None  Procedures:  ECHO  CTa  Duplex lower extremity  Antibiotics:  vanc  cipro   HPI/Subjective:  Denies CP, SOB, N/V/D/C.  Had BM overnight.  Has pain in the left extremity, but has been able to bear weight to  transfer.    Objective: Filed Vitals:   01/23/12 1500 01/23/12 2114 01/24/12 0528 01/24/12 1000  BP: 107/47 108/42 119/57 89/53  Pulse: 87 65 64 59  Temp: 97.6 F (36.4 C) 97.8 F (36.6 C) 97.6 F (36.4 C)   TempSrc: Oral Oral Oral   Resp: 20 20 18    Height:      Weight:      SpO2: 97% 95% 97%     Intake/Output Summary (Last 24 hours) at 01/24/12 1137 Last data filed at 01/24/12 0900  Gross per 24 hour  Intake    790 ml  Output   1575 ml  Net   -785 ml   Filed Weights   01/20/12 0737 01/22/12 0700 01/23/12 0700  Weight: 56.972 kg (125 lb 9.6 oz) 57 kg (125 lb 10.6 oz) 56.2 kg (123 lb 14.4 oz)    Exam:   General:  [name], no acute distress, thin  HEENT:  MMM while sipping fluids, NCAT  Cardiovascular:  RRR, no m/r/g, 1+ pedal pulses, normal S1, S2  Respiratory:  CTAB, no increased WOB  Abdomen:  NABS, soft, nondistended, nontender, no organomegaly  MSK:  2+ BLE pitting edema  Neuro:  Grossly intact  Skin:  Dry with mild skin tenting of forehead.  TED hose in place.  Less red hue to the left shin area with stable erythema of the foot but still extending up to below the knee.  Stable swelling and no warmth.    Data Reviewed: Basic  Metabolic Panel:  Lab 01/22/12 2952 01/20/12 0504 01/20/12 01/19/12 2359 01/19/12 1800  NA 134* 141 137 -- 144  K 4.2 4.2 4.7 -- 4.5  CL 101 105 101 -- 105  CO2 27 29 30  -- 31  GLUCOSE 90 90 113* -- 96  BUN 23 30* 29* -- 37*  CREATININE 1.09 1.06 1.26 1.11 1.21  CALCIUM 8.5 8.9 8.6 -- 9.4  MG -- -- -- -- --  PHOS -- -- -- -- --   Liver Function Tests:  Lab 01/22/12 0445 01/19/12 1800  AST 22 26  ALT 17 25  ALKPHOS 56 62  BILITOT 0.7 0.8  PROT 5.6* 6.5  ALBUMIN 2.4* 3.1*   No results found for this basename: LIPASE:5,AMYLASE:5 in the last 168 hours No results found for this basename: AMMONIA:5 in the last 168 hours CBC:  Lab 01/20/12 0504 01/20/12 01/19/12 2359 01/19/12 1800  WBC 6.7 6.4 6.9 8.1  NEUTROABS  -- -- -- --  HGB 11.2* 11.0* 11.1* 12.3*  HCT 33.6* 32.6* 32.2* 35.9*  MCV 94.6 95.3 94.7 94.7  PLT 149* 146* 144* 178   Cardiac Enzymes:  Lab 01/19/12 2359  CKTOTAL --  CKMB --  CKMBINDEX --  TROPONINI <0.30   BNP (last 3 results)  Basename 01/19/12 1800  PROBNP 1245.0*   CBG: No results found for this basename: GLUCAP:5 in the last 168 hours  No results found for this or any previous visit (from the past 240 hour(s)).   Studies: No results found.  Scheduled Meds:    . aspirin  325 mg Oral Daily  . atorvastatin  20 mg Oral q1800  . cholecalciferol  2,000 Units Oral Daily  . ciprofloxacin  400 mg Intravenous BID  . docusate sodium  100 mg Oral BID  . enoxaparin (LOVENOX) injection  40 mg Subcutaneous Q24H  . feeding supplement  1 Container Oral TID BM  . furosemide  20 mg Oral Daily  . loratadine  10 mg Oral Daily  . metoprolol succinate  12.5 mg Oral Daily  . omega-3 acid ethyl esters  1 g Oral Daily  . polyethylene glycol  17 g Oral Daily  . pyridOXINE  50 mg Oral BID  . sodium chloride  3 mL Intravenous Q12H  . vancomycin  750 mg Intravenous QHS   Continuous Infusions:   Principal Problem:  *Lower extremity edema Active Problems:  HYPOTENSION  CONSTIPATION  Coronary artery disease  Cellulitis  Delirium    Time spent: 30 min    Jacky Dross, [facility]  [facility] Pager [phone]. If 7PM-7AM, please contact night-coverage at [email or web address], password [facility] 01/24/2012, 11:37 AM  LOS: 5 days

## 2012-01-24 NOTE — Progress Notes (Signed)
ANTIBIOTIC CONSULT NOTE - FOLLOW UP  Pharmacy Consult for Vancomycin and Ciprofloxacin Indication: Cellulitis  No Known Allergies  Patient Measurements: Height: 5\' 6"  (167.6 cm) Weight: 123 lb 14.4 oz (56.2 kg) IBW/kg (Calculated) : 63.8  Adjusted Body Weight:   Vital Signs: Temp: 97.7 F (36.5 C) (01/19 1601) Temp src: Oral (01/19 1601) BP: 100/45 mmHg (01/19 1601) Pulse Rate: 69  (01/19 1601) Intake/Output from previous day: 01/18 0701 - 01/19 0700 In: 910 [P.O.:360; IV Piggyback:550] Out: 1200 [Urine:1200] Intake/Output from this shift:    Labs:  Basename 01/22/12 0445  WBC --  HGB --  PLT --  LABCREA --  CREATININE 1.09   Estimated Creatinine Clearance: 33.7 ml/min (by C-G formula based on Cr of 1.09).  Basename 01/24/12 2100  VANCOTROUGH 9.3*  VANCOPEAK --  Drue Dun --  GENTTROUGH --  GENTPEAK --  GENTRANDOM --  TOBRATROUGH --  TOBRAPEAK --  TOBRARND --  AMIKACINPEAK --  AMIKACINTROU --  AMIKACIN --     Microbiology: No results found for this or any previous visit (from the past 720 hour(s)).  Anti-infectives     Start     Dose/Rate Route Frequency Ordered Stop   01/20/12 2200   vancomycin (VANCOCIN) 750 mg in sodium chloride 0.9 % 150 mL IVPB        750 mg 150 mL/hr over 60 Minutes Intravenous Daily at bedtime 01/20/12 0137     01/20/12 1000   ciprofloxacin (CIPRO) IVPB 400 mg        400 mg 200 mL/hr over 60 Minutes Intravenous 2 times daily 01/20/12 0137     01/19/12 2330   ciprofloxacin (CIPRO) IVPB 400 mg        400 mg 200 mL/hr over 60 Minutes Intravenous  Once 01/19/12 2321 01/20/12 0145   01/19/12 2015   vancomycin (VANCOCIN) IVPB 1000 mg/200 mL premix        1,000 mg 200 mL/hr over 60 Minutes Intravenous  Once 01/19/12 2008 01/19/12 2207          Assessment: 89 yoM on D#5 IV Vancomycin and ciprofloxacin for cellulitis.    Afebrile  WBC WNL (1/15)  Renal fxn stable - CrCl ~ 34 (N43), which is near borderline (<30) for  adjusting ciprofloxacin.   VT=9.3 mcg/ml after 750mg  daily @ [facility].  Just slightly below goal for cellulitis, will not change due to pt age/RF drug accumulation likely.  Goal of Therapy:  Vancomycin trough level 10-15 mcg/ml  Plan:  1.  Continue Vancomcyin and ciprofloxacin as ordered.  2.  F/u VT at steady state 3.  F/u renal fxn, T, WBC, clinical course  Lorenza Evangelist  01/24/2012 10:16 PM

## 2012-01-25 DIAGNOSIS — R404 Transient alteration of awareness: Secondary | ICD-10-CM

## 2012-01-25 DIAGNOSIS — I779 Disorder of arteries and arterioles, unspecified: Secondary | ICD-10-CM

## 2012-01-25 DIAGNOSIS — I359 Nonrheumatic aortic valve disorder, unspecified: Secondary | ICD-10-CM

## 2012-01-25 LAB — URINE CULTURE
Colony Count: NO GROWTH
Culture: NO GROWTH

## 2012-01-25 MED ORDER — MAGNESIUM HYDROXIDE 400 MG/5ML PO SUSP: 30.0000 mL | Freq: Every day | ORAL | Status: AC | PRN

## 2012-01-25 MED ORDER — ACETAMINOPHEN 325 MG PO TABS: 650.0000 mg | ORAL_TABLET | Freq: Four times a day (QID) | ORAL | Status: AC | PRN

## 2012-01-25 MED ORDER — HALOPERIDOL 1 MG PO TABS: 1.0000 mg | ORAL_TABLET | Freq: Four times a day (QID) | ORAL | Status: DC | PRN

## 2012-01-25 MED ORDER — SENNA 8.6 MG PO TABS: 2.0000 | ORAL_TABLET | Freq: Every day | ORAL | Status: AC | PRN

## 2012-01-25 MED ORDER — TRAMADOL HCL 50 MG PO TABS: 50.0000 mg | ORAL_TABLET | Freq: Four times a day (QID) | ORAL | Status: DC | PRN

## 2012-01-25 MED ORDER — ATORVASTATIN CALCIUM 10 MG PO TABS
10.0000 mg | ORAL_TABLET | Freq: Every day | ORAL | Status: DC
  Filled 2012-01-25: qty 1

## 2012-01-25 MED ORDER — CIPROFLOXACIN HCL 500 MG PO TABS: 750.0000 mg | ORAL_TABLET | Freq: Two times a day (BID) | ORAL | Status: DC

## 2012-01-25 MED ORDER — BISACODYL 10 MG RE SUPP: 10.0000 mg | Freq: Every day | RECTAL | Status: DC | PRN

## 2012-01-25 MED ORDER — ENSURE PUDDING PO PUDG: 1.0000 | Freq: Three times a day (TID) | ORAL | Status: DC

## 2012-01-25 MED ORDER — SULFAMETHOXAZOLE-TMP DS 800-160 MG PO TABS: 2.0000 | ORAL_TABLET | Freq: Two times a day (BID) | ORAL | Status: DC

## 2012-01-25 MED ORDER — DSS 100 MG PO CAPS: 100.0000 mg | ORAL_CAPSULE | Freq: Two times a day (BID) | ORAL | Status: AC

## 2012-01-25 NOTE — Progress Notes (Signed)
Patient is set to discharge to [facility] today. Patient's significant other, Billey Gosling (ph#: [phone]) made aware. [facility] called for 4:45 pickup to give Peggy time to get to hospital as she wants to be here when transport picks him up.   Clinical Social Work Department CLINICAL SOCIAL WORK PLACEMENT NOTE 01/25/2012  Patient:  [name], [name]  Account Number:  [id number] Admit date:  01/19/2012  Clinical Social Worker:  Orpah Greek  Date/time:  01/22/2012 12:22 PM  Clinical Social Work is seeking post-discharge placement for this patient at the following level of care:   SKILLED NURSING   (*CSW will update this form in Epic as items are completed)   01/22/2012  Patient/family provided with [facility] Department of Clinical Social Work's list of facilities offering this level of care within the geographic area requested by the patient (or if unable, by the patient's family).  01/22/2012  Patient/family informed of their freedom to choose among providers that offer the needed level of care, that participate in Medicare, Medicaid or managed care program needed by the patient, have an available bed and are willing to accept the patient.  01/22/2012  Patient/family informed of [facility]' ownership interest in [facility], as well as of the fact that they are under no obligation to receive care at this facility.  PASARR submitted to EDS on 01/22/2012 PASARR number received from EDS on 01/22/2012  FL2 transmitted to all facilities in geographic area requested by pt/family on  01/22/2012 FL2 transmitted to all facilities within larger geographic area on   Patient informed that his/her managed care company has contracts with or will negotiate with  certain facilities, including the following:     Patient/family informed of bed offers received:  01/22/2012 Patient chooses bed at [facility] Physician recommends and patient chooses bed at     Patient to be transferred to [facility] [place] on  01/25/2012 Patient to be transferred to facility by [facility]  The following physician request were entered in Epic:   Additional Comments:  Unice Bailey, LCSW [facility] Clinical Social Worker cell #: [phone]

## 2012-01-25 NOTE — Progress Notes (Signed)
Occupational Therapy Treatment Patient Details Name: [name] MRN: [id number] DOB: 10/13/1916 Today's Date: 01/25/2012 Time: 4098-1191 OT Time Calculation (min): 27 min  OT Assessment / Plan / Recommendation Comments on Treatment Session Pt progressing slowly.  Pt was able to get up to chair, but he has a strong posterior lean.  Able to correct in sitting with multimodal cues but unable to correct in standiing.      Follow Up Recommendations  SNF    Barriers to Discharge       Equipment Recommendations  3 in 1 bedside comode    Recommendations for Other Services    Frequency Min 2X/week   Plan      Precautions / Restrictions Precautions Precautions: Fall Restrictions Weight Bearing Restrictions: No   Pertinent Vitals/Pain No c/o pain    ADL  Toilet Transfer: Simulated;+2 Total assistance Toilet Transfer: Patient Percentage: 40% (pt with strong posterior lean) Toilet Transfer Method: Stand pivot Equipment Used: Rolling walker Transfers/Ambulation Related to ADLs: Pt was able to sit eob and achieve neutral positioning with tactile and verbal cues as well as bil UE support.  Used walker to get to recliner via a spt, but pt leans posteriorly.   RN helped me--recommended maximove back to bed if pt returns to bed before PT arrives.   ADL Comments: Pt did not want to perform any grooming tasks--this will have to be done in supported sitting.  Bathing and dressing were already done earlier.    OT Diagnosis:    OT Problem List:   OT Treatment Interventions:     OT Goals Acute Rehab OT Goals Time For Goal Achievement: 02/04/12 ADL Goals Pt Will Perform Grooming: with set-up;Sitting, chair;Unsupported (revised to supported sitting) Pt Will Transfer to Toilet: with mod assist;with DME;Stand pivot transfer ADL Goal: Toilet Transfer - Progress:  (slow progress revised to spt/no ambulation) Additional ADL Goal #1: Pt will complete supine<>sit with min-mod A as a precurser for  ADLs. ADL Goal: Additional Goal #1 - Progress: Progressing toward goals Additional ADL Goal #2: Pt will tolerate 1 min of static standing with mod A in prep for standing ADLs. ADL Goal: Additional Goal #2 - Progress: Revised due to lack of progress  Visit Information  Last OT Received On: 01/25/12 Assistance Needed: +2    Subjective Data      Prior Functioning       Cognition  Overall Cognitive Status: Appears within functional limits for tasks assessed/performed Arousal/Alertness: Awake/alert Orientation Level: Appears intact for tasks assessed Behavior During Session: [facility] for tasks performed    Mobility  Shoulder Instructions Bed Mobility Rolling Right: 2: Max assist;With rail Right Sidelying to Sit: 2: Max assist;HOB flat Transfers Sit to Stand: 1: +2 Total assist;With upper extremity assist;From bed;From elevated surface Sit to Stand: Patient Percentage: 40% Stand to Sit: 1: +2 Total assist;To chair/3-in-1 Stand to Sit: Patient Percentage: 40% Details for Transfer Assistance: assistance to rise  Pt was able to take pivotal steps today       Exercises      Balance Static Sitting Balance Static Sitting - Balance Support: Bilateral upper extremity supported;Feet supported Static Sitting - Level of Assistance: 4: Min assist;5: Stand by assistance (variable)   End of Session OT - End of Session Activity Tolerance: Patient limited by fatigue Patient left: in chair;with call bell/phone within reach;with chair alarm set  GO     [name] 01/25/2012, 1:18 PM Marica Otter, OTR/L [phone] 01/25/2012

## 2012-01-25 NOTE — Progress Notes (Signed)
Physical Therapy Treatment Patient Details Name: Pascal Stiggers MRN: [id number] DOB: 08/02/1916 Today's Date: 01/25/2012 Time: 1191-4782 PT Time Calculation (min): 15 min  PT Assessment / Plan / Recommendation Comments on Treatment Session  Pt OOB to recliner via OT.  After lunch PT assisted back to bed.  Pt unable to take any functional steps so performed "Bear Hug" squat pivot transfer from reclining back to bed with increased time and ease.  Pt plans to D/C to SNF.    Follow Up Recommendations  SNF     Does the patient have the potential to tolerate intense rehabilitation     Barriers to Discharge        Equipment Recommendations  None recommended by PT    Recommendations for Other Services    Frequency Min 3X/week   Plan Discharge plan remains appropriate    Precautions / Restrictions Precautions Precautions: Fall Restrictions Weight Bearing Restrictions: No   Pertinent Vitals/Pain No c/o pain    Mobility  Bed Mobility Rolling Right: 2: Max assist;With rail Right Sidelying to Sit: 2: Max assist;HOB flat Sit to Supine: 1: +1 Total assist Details for Bed Mobility Assistance: Total assist + 2 pt 10% with use of bed pad to adjust pt to [facility] and pillows for comfort. Transfers Transfers: Squat Pivot Transfers Sit to Stand: 1: +1 Total assist;From chair/3-in-1 Sit to Stand: Patient Percentage: 30% Stand to Sit: 1: +1 Total assist;To bed Stand to Sit: Patient Percentage: 30% Details for Transfer Assistance: Assisted pt with scooting to edge of chair at max assist.  Pt rigiid and demon posterior lean.  After pt was able to place B feet on floor, performed "bear hug" pivot trnasfer 1/4 turn back to bed as pt was too fatigue to attempt sit to stand.  "I've been in this chair for several hours" stated pt.   Ambulation/Gait Ambulation/Gait Assistance Details: Unable to attempt 2nd level assist needed for transfer     PT Goals                                                      progressing    Visit Information  Last PT Received On: 01/25/12 Assistance Needed: +2    Subjective Data  Subjective: I am ready to go back to bed   Cognition  Overall Cognitive Status: Appears within functional limits for tasks assessed/performed Arousal/Alertness: Awake/alert Orientation Level: Appears intact for tasks assessed Behavior During Session: [facility] for tasks performed    Balance  Static Sitting Balance Static Sitting - Balance Support: Bilateral upper extremity supported;Feet supported Static Sitting - Level of Assistance: 4: Min assist;5: Stand by assistance (variable)  End of Session PT - End of Session Equipment Utilized During Treatment: Gait belt Activity Tolerance: Patient limited by fatigue Patient left: in bed;with call bell/phone within reach;with bed alarm set   Felecia Shelling  PTA [facility]  Acute  Rehab Pager     [phone]

## 2012-01-25 NOTE — Discharge Summary (Addendum)
Physician Discharge Summary  [name] ZOX:[id number] DOB: 26-Jan-1916 DOA: 01/19/2012  PCP: Randy Mcalpine, MD  Admit date: 01/19/2012 Discharge date: 01/25/2012  Recommendations for Outpatient Follow-up:  1. To SNF for ongoing PT/OT 2. Falls precautions and 3-in-1 bedside commode 3. GI, Dr. Arlyce Dice in 1 month for further evaluation of dysphagia 4. PCP in 1-2 weeks for reexamination of legs and to restart lasix and blood pressure medications if necessary 5.   Bactrim DS 2 tabs BID and ciprofloxacin 750mg  po BID until end of day on 1/24, then stop   Discharge Diagnoses:  Principal Problem:  *Lower extremity edema Active Problems:  CONSTIPATION  Coronary artery disease  Cellulitis  Delirium   Discharge Condition: stable, improved  Diet recommendation:   Regular;Thin liquid  Liquid Administration via: Cup;Straw  Medication Administration: Other (Comment) (as tolerated)  Supervision: Patient able to self feed  Compensations: Slow rate;Small sips/bites  Postural Changes and/or Swallow Maneuvers: Upright 30-60 min after meal;Seated upright 90 degrees    Wt Readings from Last 3 Encounters:  01/25/12 57.9 kg (127 lb 10.3 oz)  01/19/12 58.695 kg (129 lb 6.4 oz)  09/16/11 55.248 kg (121 lb 12.8 oz)    History of present illness:   79 year-old male with history of CAD status post CABG, hyperlipidemia was referred to the ER by patient's PCP as patient was found to have increasing lower extremity edema more on the left side concerning for cellulitis and in the office today was also found to be mildly hypotensive. Patient states that he has been having chronic lower extremity edema but has worsened last 2 weeks more on the left side. Patient's left leg is also mildly erythematous which patient has not noticed. Denies any fever chills any insect bites or trauma. Over the last 2 weeks patient also has been having increasing weakness and did fall last week but did not lose consciousness.  Did not hit his head. Since his lower extremity edema was worsening he was told to double his Lasix dose which he has done last 2 days. Patient otherwise denies any chest pain now though 2 days ago he had some retrosternal discomfort which resolved after taking aspirin. The chest pain was burning in sensation. Patient states he is chronically [name] of breath and at this time is not in acute distress. In the ER patient was initially found to be having low blood pressure by the time I examined the patient his systolic blood pressure has come up to 116. Patient will be admitted for further management.    Hospital Course:   Lower extremity edema - most likely venous stasis dermatitis with cellulitis.  The patient was started on vancomycin and ciprofloxacin IV and completed 6 days of antibiotics in the hospital.  His lower extremities were elevated and TED hose were applied.  He should complete 4 more days of antibiotics, last day on 1/24, to complete a 10-day course.  He should continue elevation of the lower extremities and TED hose to reduce swelling.    He underwent evaluation for lower extremity edema, including ECHO to rule out CHF and UA.  No evidence of CHF of ECHO (see report below) or proteinuria.  Duplex was negative.  Most likely he has a combination of protein-calorie malnutrition with hypoalbuminemia and venous stasis contributing to his LEE.  He should be encouraged to eat well and eat supplements.  His diet should be regular to promote healthy appetite.    He endorsed some problems with swallowing food out of  his mouth and was seen by speech therapy who also elicited a history of difficulty getting food to go down the throat.  He should follow up with his gastroenterologist within the next month for further investigation.   Delirium:  He had some confusion that was likely some hospital delirium, side effect of sedating medications superimposed on mild dementia.  Minimized narcotic medications  for pain and he was started on haldol as needed.  He should be reoriented frequently with lights on during the day and off at night and up and ambulating/sitting in chair during waking hours.  His mentation improved.    Hypotension, mild and intermittent.  He was given gentle IVF and his lasix were held and his hypotension resolved.  His blood pressure medications were held except for metoprolol.  If persistent as an outpatient, consider cortisol level.    CHF last EF preserved 65%. See above.  CAD status post CABG - Presently chest pain-free. EKG shows RBBB. Troponin neg.  Hyperlipidemia - continued home medications.  Constipation:  Chronic problem.  Colace, senna, miralax daily and milk of magnesia and bisacodyl or senna for moderate constipation.    Protein calorie malnutrition: supplements, liberalize diet.   Consultants:  None Procedures:  ECHO  CTa  Duplex lower extremity Antibiotics:  vanc 1/14 (late)  cipro 1/15  Discharge Exam: Filed Vitals:   01/25/12 0403  BP: 124/38  Pulse: 56  Temp: 97.3 F (36.3 C)  Resp: 16   Filed Vitals:   01/24/12 1047 01/24/12 1601 01/24/12 2300 01/25/12 0403  BP: 89/53 100/45 121/42 124/38  Pulse: 59 69 58 56  Temp:  97.7 F (36.5 C) 97.5 F (36.4 C) 97.3 F (36.3 C)  TempSrc:  Oral Oral Oral  Resp:  16 16 16   Height:      Weight:    57.9 kg (127 lb 10.3 oz)  SpO2:  97% 95% 95%    General: [name], no acute distress, thin  HEENT: MMM, NCAT Cardiovascular: RRR, no m/r/g, 1+ pedal pulses, normal S1, S2  Respiratory: CTAB, no increased WOB  Abdomen: NABS, soft, nondistended, nontender, no organomegaly  MSK: 1+ BLE pitting edema of lower extremities.  TED hose in place Neuro: Grossly intact  Skin:  Darker erythema of the left shin area with stable lighter erythema of the foot that extende up leg to just below the knee. Improved.    Discharge Instructions      Discharge Orders    Future Appointments: Provider:  Department: Dept Phone: Center:   01/28/2012 2:00 PM Randy Mcalpine, MD [facility] [phone] None     Future Orders Please Complete By Expires   Diet general      Increase activity slowly      Discharge instructions      Comments:   You were hospitalized with swelling and cellulitis of your left leg.  You were treated with IV antibiotics for 6 days in the hospital.  You were tested for causes of swelling of the legs, however, your tests for heart failure, protein in the urine, and blood clot were negative.  You should take antibiotics this evening by mouth and for 4 additional days.  Please elevate your legs and wear TED hose during the day to prevent swelling of your legs.  FOllow up with your primary care doctor in 1-2 weeks for reevaluation of your legs.  You had mildly low blood pressures and your blood pressure medications were stopped except for metoprolol.  Call MD for:  temperature >100.4      Call MD for:  persistant nausea and vomiting      Call MD for:  severe uncontrolled pain      Call MD for:  difficulty breathing, headache or visual disturbances      Call MD for:  hives      Call MD for:  persistant dizziness or light-headedness      Call MD for:  extreme fatigue      (HEART FAILURE PATIENTS) Call MD:  Anytime you have any of the following symptoms: 1) 3 pound weight gain in 24 hours or 5 pounds in 1 week 2) shortness of breath, with or without a dry hacking cough 3) swelling in the hands, feet or stomach 4) if you have to sleep on extra pillows at night in order to breathe.          Medication List     As of 01/25/2012  3:07 PM    STOP taking these medications         aspirin 325 MG tablet      furosemide 40 MG tablet   Commonly known as: LASIX      isosorbide mononitrate 30 MG 24 hr tablet   Commonly known as: IMDUR      TAKE these medications         acetaminophen 325 MG tablet   Commonly known as: TYLENOL   Take 2 tablets (650 mg total) by mouth  every 6 (six) hours as needed (or Fever >/= 101).      atorvastatin 20 MG tablet   Commonly known as: LIPITOR   Take 20 mg by mouth daily.      BAYER LOW STRENGTH 81 MG EC tablet   Generic drug: aspirin   Take 81 mg by mouth daily.      bisacodyl 10 MG suppository   Commonly known as: DULCOLAX   Place 1 suppository (10 mg total) rectally daily as needed for constipation.      Calcium-Magnesium-Zinc 333-133-5 MG Tabs   Take 1 tablet by mouth 4 (four) times daily.      ciprofloxacin 500 MG tablet   Commonly known as: CIPRO   Take 1.5 tablets (750 mg total) by mouth 2 (two) times daily.      Co Q-10 200 MG Caps   Take 1 capsule by mouth daily.      desloratadine 5 MG tablet   Commonly known as: CLARINEX   Take 5 mg by mouth daily.      DSS 100 MG Caps   Take 100 mg by mouth 2 (two) times daily.      feeding supplement Pudg   Take 1 Container by mouth 3 (three) times daily between meals.      Fish Oil 500 MG Caps   Take 1 capsule by mouth daily.      haloperidol 1 MG tablet   Commonly known as: HALDOL   Take 1 tablet (1 mg total) by mouth every 6 (six) hours as needed (agitation).      magnesium hydroxide 400 MG/5ML suspension   Commonly known as: MILK OF MAGNESIA   Take 30 mLs by mouth daily as needed for constipation.      metoprolol succinate 25 MG 24 hr tablet   Commonly known as: TOPROL-XL   Take 12.5 mg by mouth daily.      MIRALAX powder   Generic drug: polyethylene glycol powder   1 capful in  water daily      multivitamin per tablet   Take 1 tablet by mouth daily.      pyridOXINE 50 MG tablet   Commonly known as: VITAMIN B-6   Take 50 mg by mouth 2 (two) times daily.      senna 8.6 MG Tabs   Commonly known as: SENOKOT   Take 2 tablets (17.2 mg total) by mouth daily as needed (constipation).      sulfamethoxazole-trimethoprim 800-160 MG per tablet   Commonly known as: BACTRIM DS   Take 2 tablets by mouth 2 (two) times daily.      traMADol 50 MG  tablet   Commonly known as: ULTRAM   Take 1 tablet (50 mg total) by mouth every 6 (six) hours as needed for pain.      Vitamin D3 2000 UNITS capsule   Take 2,000 Units by mouth daily.        Follow-up Information    Follow up with NADEL,SCOTT M, MD. Schedule an appointment as soon as possible for a visit in 2 weeks.   Contact information:   [facility] [address] [place] [address] [phone]       Follow up with Melvia Heaps, MD. Schedule an appointment as soon as possible for a visit in 1 month.   Contact information:   [address] [place] [address] [phone]           The results of significant diagnostics from this hospitalization (including imaging, microbiology, ancillary and laboratory) are listed below for reference.    Significant Diagnostic Studies: Dg Chest 2 View  01/19/2012  *RADIOLOGY REPORT*  Clinical Data: Leg swelling and weakness  CHEST - 2 VIEW  Comparison: 09/08/2010  Findings: Previous median sternotomy and CABG procedure.  Normal heart size.  No pleural effusions or edema.  Bibasilar atelectasis is noted.  No airspace consolidation identified.  Review of the visualized osseous structures is significant for mild spondylosis within the thoracic spine.  IMPRESSION:  1.  New bibasilar atelectasis. 2.  No pneumonia or heart failure.   Original Report Authenticated By: Signa Kell, M.D.    Ct Angio Chest Pe W/cm &/or Wo Cm  01/20/2012  *RADIOLOGY REPORT*  Clinical Data: Shortness of breath and elevated D-dimer.  Question pulmonary embolus.  CT ANGIOGRAPHY CHEST  Technique:  Multidetector CT imaging of the chest using the standard protocol during bolus administration of intravenous contrast. Multiplanar reconstructed images including MIPs were obtained and reviewed to evaluate the vascular anatomy.  Contrast: OMNIPAQUE IOHEXOL 350 MG/ML SOLN  Comparison: 08/26/2009.  Findings: Respiratory  motion degrades image quality in the lung bases, limiting evaluation for segmental and subsegmental pulmonary emboli.  Otherwise, no pulmonary embolus.  No pathologically enlarged mediastinal, hilar or axillary lymph nodes.  The esophagus is mildly dilated and fluid-filled.  Atherosclerotic calcification of the arterial vasculature.  Heart size within normal limits.  No pericardial effusion.  Calcified pleural plaques in the right hemithorax.  Scarring in the right lower lobe.  Tiny amount of loculated pleural fluid and adjacent volume loss along an elevated left hemidiaphragm, new.  No pleural fluid.  Airway is unremarkable.  Incidental imaging of the upper abdomen shows low attenuation lesions in the kidneys measuring up to 5.1 cm. No worrisome lytic or sclerotic lesions.  IMPRESSION:  1.  Respiratory motion limits evaluation for segmental and subsegmental pulmonary emboli at the lung bases.  Otherwise, no  pulmonary embolus. 2.  Small amount of loculated pleural fluid and associated volume loss adjacent to an elevated left hemidiaphragm.   Original Report Authenticated By: Leanna Battles, M.D.     Microbiology: Recent Results (from the past 240 hour(s))  URINE CULTURE     Status: Normal   Collection Time   01/23/12  8:00 AM      Component Value Range Status Comment   Specimen Description URINE, CLEAN CATCH   Final    Special Requests NONE   Final    Culture  Setup Time 01/24/2012 03:28   Final    Colony Count NO GROWTH   Final    Culture NO GROWTH   Final    Report Status 01/25/2012 FINAL   Final      Labs: Basic Metabolic Panel:  Lab 01/22/12 9604 01/20/12 0504 01/20/12 01/19/12 2359 01/19/12 1800  NA 134* 141 137 -- 144  K 4.2 4.2 4.7 -- 4.5  CL 101 105 101 -- 105  CO2 27 29 30  -- 31  GLUCOSE 90 90 113* -- 96  BUN 23 30* 29* -- 37*  CREATININE 1.09 1.06 1.26 1.11 1.21  CALCIUM 8.5 8.9 8.6 -- 9.4  MG -- -- -- -- --  PHOS -- -- -- -- --   Liver Function Tests:  Lab 01/22/12 0445  01/19/12 1800  AST 22 26  ALT 17 25  ALKPHOS 56 62  BILITOT 0.7 0.8  PROT 5.6* 6.5  ALBUMIN 2.4* 3.1*   No results found for this basename: LIPASE:5,AMYLASE:5 in the last 168 hours No results found for this basename: AMMONIA:5 in the last 168 hours CBC:  Lab 01/20/12 0504 01/20/12 01/19/12 2359 01/19/12 1800  WBC 6.7 6.4 6.9 8.1  NEUTROABS -- -- -- --  HGB 11.2* 11.0* 11.1* 12.3*  HCT 33.6* 32.6* 32.2* 35.9*  MCV 94.6 95.3 94.7 94.7  PLT 149* 146* 144* 178   Cardiac Enzymes:  Lab 01/19/12 2359  CKTOTAL --  CKMB --  CKMBINDEX --  TROPONINI <0.30   BNP: BNP (last 3 results)  Basename 01/19/12 1800  PROBNP 1245.0*   CBG: No results found for this basename: GLUCAP:5 in the last 168 hours  Time coordinating discharge: 45 minutes  Signed:  Westlynn Fifer  [facility] 01/25/2012, 3:07 PM
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
## 2012-01-28 ENCOUNTER — Ambulatory Visit: Payer: Medicare Other | Admitting: Pulmonary Disease

## 2012-01-29 ENCOUNTER — Other Ambulatory Visit: Payer: Self-pay | Admitting: Nurse Practitioner

## 2012-02-04 DIAGNOSIS — R5381 Other malaise: Secondary | ICD-10-CM | POA: Insufficient documentation

## 2012-03-02 ENCOUNTER — Encounter: Payer: Self-pay | Admitting: *Deleted

## 2012-03-18 ENCOUNTER — Telehealth: Payer: Self-pay | Admitting: Gastroenterology

## 2012-03-21 NOTE — Telephone Encounter (Signed)
Pt was seen in the ER for difficulty swallowing. States pt needs F/U ov. Pt scheduled to see Dr. Arlyce Dice 03/29/12@2 :30pm. Esther aware of appt date and time.

## 2012-03-21 NOTE — Telephone Encounter (Signed)
Left message for [name] to call back.

## 2012-03-29 ENCOUNTER — Encounter: Payer: Self-pay | Admitting: Gastroenterology

## 2012-03-29 ENCOUNTER — Ambulatory Visit (INDEPENDENT_AMBULATORY_CARE_PROVIDER_SITE_OTHER): Payer: Medicare Other | Admitting: Gastroenterology

## 2012-03-29 VITALS — BP 92/48 | HR 64 | Ht 66.0 in | Wt 124.0 lb

## 2012-03-29 DIAGNOSIS — R1314 Dysphagia, pharyngoesophageal phase: Secondary | ICD-10-CM

## 2012-03-29 NOTE — Assessment & Plan Note (Signed)
Patient appears to be having difficulties with deglutition which may be a reflection of his overall weakness. Esophageal stricture is less likely in the absence of frank dysphagia.  Recommendations  1.  formal dysphagia study - patient will take this under advisement 2.  nutritional supplements such as Ensure or boost

## 2012-03-29 NOTE — Patient Instructions (Addendum)
It has been recommended by Dr Arlyce Dice that you have an Barium  Swallow Dysphagia study. Modified Barium Swallow You have refused to schedule this appointment today
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
## 2012-03-30 ENCOUNTER — Telehealth: Payer: Self-pay | Admitting: Gastroenterology

## 2012-03-30 NOTE — Telephone Encounter (Signed)
Son called regarding his father. Returned his call and he states he did not ask to speak to me he demands to speak to Dr. Arlyce Dice. Let him know Dr. Arlyce Dice is not in the office today. Son states that is unacceptable and wants a call back from Dr. Arlyce Dice today. Let him know that was not going to happen today and that I would send Dr. Arlyce Dice a message to call him back tomorrow. Dr. Arlyce Dice please call this pts son at [phone].

## 2012-03-31 ENCOUNTER — Telehealth: Payer: Self-pay | Admitting: Gastroenterology

## 2012-04-01 ENCOUNTER — Non-Acute Institutional Stay (SKILLED_NURSING_FACILITY): Payer: Medicare Other | Admitting: Nurse Practitioner

## 2012-04-01 DIAGNOSIS — R609 Edema, unspecified: Secondary | ICD-10-CM

## 2012-04-01 DIAGNOSIS — E785 Hyperlipidemia, unspecified: Secondary | ICD-10-CM

## 2012-04-01 DIAGNOSIS — I872 Venous insufficiency (chronic) (peripheral): Secondary | ICD-10-CM

## 2012-04-01 DIAGNOSIS — R269 Unspecified abnormalities of gait and mobility: Secondary | ICD-10-CM

## 2012-04-01 DIAGNOSIS — F0390 Unspecified dementia without behavioral disturbance: Secondary | ICD-10-CM

## 2012-04-01 NOTE — Telephone Encounter (Signed)
I called the patient's son twice on March 27 and left a message
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
## 2012-04-15 ENCOUNTER — Non-Acute Institutional Stay (SKILLED_NURSING_FACILITY): Payer: Medicare Other | Admitting: Nurse Practitioner

## 2012-04-15 DIAGNOSIS — K222 Esophageal obstruction: Secondary | ICD-10-CM

## 2012-04-15 DIAGNOSIS — Z951 Presence of aortocoronary bypass graft: Secondary | ICD-10-CM

## 2012-04-15 DIAGNOSIS — M79609 Pain in unspecified limb: Secondary | ICD-10-CM

## 2012-04-15 DIAGNOSIS — F0390 Unspecified dementia without behavioral disturbance: Secondary | ICD-10-CM

## 2012-04-15 DIAGNOSIS — R609 Edema, unspecified: Secondary | ICD-10-CM

## 2012-04-15 NOTE — Assessment & Plan Note (Signed)
LLE--not on diuretic--stopped while in hospital early this year   

## 2012-04-15 NOTE — Assessment & Plan Note (Signed)
Has been chronic swollen--recently cellulitis, hx of surgery with surgical clips seen on X-ray

## 2012-04-15 NOTE — Assessment & Plan Note (Signed)
Slow mentation but I believe his cognition is adequate in AL setting.

## 2012-04-15 NOTE — Assessment & Plan Note (Signed)
No angina 

## 2012-04-15 NOTE — Assessment & Plan Note (Signed)
Said he had a piece of chicken stuck in the middle of his esophagus--happened before--he will skip his dinner, he believes it will work its way downward, hx of dysphagia, he has been refuses diet modification and GI workup--now agreed to have barium swallow study.
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
## 2012-04-18 ENCOUNTER — Other Ambulatory Visit: Payer: Self-pay | Admitting: Internal Medicine

## 2012-04-18 DIAGNOSIS — R1310 Dysphagia, unspecified: Secondary | ICD-10-CM

## 2012-04-20 ENCOUNTER — Ambulatory Visit (HOSPITAL_COMMUNITY)
Admission: RE | Admit: 2012-04-20 | Discharge: 2012-04-20 | Disposition: A | Discharge status: Home / Self Care | Payer: Medicare Other | Source: Ambulatory Visit | Attending: Internal Medicine | Admitting: Internal Medicine

## 2012-04-20 DIAGNOSIS — R1310 Dysphagia, unspecified: Secondary | ICD-10-CM | POA: Insufficient documentation

## 2012-04-20 DIAGNOSIS — K224 Dyskinesia of esophagus: Secondary | ICD-10-CM | POA: Insufficient documentation

## 2012-04-20 DIAGNOSIS — K449 Diaphragmatic hernia without obstruction or gangrene: Secondary | ICD-10-CM | POA: Insufficient documentation

## 2012-04-26 ENCOUNTER — Telehealth: Payer: Self-pay | Admitting: *Deleted

## 2012-04-26 NOTE — Telephone Encounter (Signed)
Dr Arlyce Dice, Patient and Nursing home wants to know what the next step for this patinet is  Review results

## 2012-04-27 NOTE — Telephone Encounter (Signed)
Tried To contact [facility] No answer

## 2012-04-27 NOTE — Telephone Encounter (Signed)
Barium swallow demonstrated a mid esophageal stricture and a possible stricture at the distal esophagus.  Let's schedule him for EGD with Savary dilatation under fluoroscopy.

## 2012-05-03 ENCOUNTER — Telehealth: Payer: Self-pay | Admitting: Gastroenterology

## 2012-05-03 NOTE — Telephone Encounter (Signed)
Pts son would like Dr. Arlyce Dice to call him and discuss with him the significance of the findings from his father's barium swallow. Dr. Arlyce Dice please call the pts son [name] at [phone].

## 2012-05-03 NOTE — Telephone Encounter (Signed)
Called and left message with son, explained that xray shows a esophageal stricture.

## 2012-05-10 ENCOUNTER — Non-Acute Institutional Stay (SKILLED_NURSING_FACILITY): Payer: Medicare Other | Admitting: Nurse Practitioner

## 2012-05-10 DIAGNOSIS — K5900 Constipation, unspecified: Secondary | ICD-10-CM

## 2012-05-10 DIAGNOSIS — E039 Hypothyroidism, unspecified: Secondary | ICD-10-CM | POA: Insufficient documentation

## 2012-05-10 DIAGNOSIS — F329 Major depressive disorder, single episode, unspecified: Secondary | ICD-10-CM

## 2012-05-10 DIAGNOSIS — R609 Edema, unspecified: Secondary | ICD-10-CM

## 2012-05-10 DIAGNOSIS — I959 Hypotension, unspecified: Secondary | ICD-10-CM

## 2012-05-10 DIAGNOSIS — K222 Esophageal obstruction: Secondary | ICD-10-CM

## 2012-05-10 NOTE — Assessment & Plan Note (Addendum)
  04/20/12 fluoroscope esophagus  IMPRESSION:  1. Persistent area of smooth focal narrowing in the mid distal  esophagus. This is likely a benign stricture. Endoscopic  evaluation may be helpful for further evaluation and treatment.  2. Mild narrowing just above the esophageal vestibule.  3. Small hiatal hernia.  4. Esophageal dysmotility. F/u GI--no further choking episodes, he tolerated modified diet so far.

## 2012-05-10 NOTE — Assessment & Plan Note (Signed)
3-4 yesterday and x1 today w/o N/V/abd pain--will hold MiraLax until loose stools resolved.

## 2012-05-10 NOTE — Telephone Encounter (Signed)
I believe he's residing at a nursing home.

## 2012-05-10 NOTE — Assessment & Plan Note (Signed)
Resolved. Currently on Metoprolol 12.5mg  daily

## 2012-05-10 NOTE — Telephone Encounter (Signed)
Mailed letter to [name] today regarding scheduling his hospital procedure  FYI Dr Arlyce Dice I have tried to contact this [name] to schedule his endoscopy procedure and mailed letter today. Cant get in touch with him

## 2012-05-10 NOTE — Progress Notes (Signed)
Patient ID: [name], male   DOB: September 13, 1916, 79 y.o.   MRN: [id number]  Chief Complaint:  Chief Complaint  Patient presents with  . Medical Managment of Chronic Issues    c/o losing weight, not sleeping well, feeling sad     HPI:   Problem List Items Addressed This Visit     ICD-9-CM   ESOPHAGEAL STRICTURE       04/20/12 fluoroscope esophagus  IMPRESSION:  1. Persistent area of smooth focal narrowing in the mid distal  esophagus. This is likely a benign stricture. Endoscopic  evaluation may be helpful for further evaluation and treatment.  2. Mild narrowing just above the esophageal vestibule.  3. Small hiatal hernia.  4. Esophageal dysmotility. F/u GI--no further choking episodes, he tolerated modified diet so far.      Edema     LLE--not on diuretic--stopped while in hospital early this year      Unspecified hypothyroidism     Takes Levothyroxine , will update TSH    RESOLVED: HYPOTENSION     Resolved. Currently on Metoprolol 12.5mg  daily    Depression - Primary (Chronic)     Feeling down and sad, c/o his health is failing, losing weight:  weighted #409-811 recently, was 122 before, not sleeping well at night. Will tray Remeron 7.5mg  nightly, check CBC/CMP/TSH next lab.        Review of Systems:  Review of Systems  Constitutional: Positive for fatigue. Negative for fever, chills and appetite change.  HENT: Negative.   Eyes: Negative for pain and visual disturbance.  Respiratory: Negative for cough, shortness of breath and wheezing.   Cardiovascular: Positive for leg swelling (s/p CABG 1985, left lower leg showed multiple surgical clips. Edema in ankles -most in the left ankle and lower leg with latearl aspect mild erythema/warmth). Negative for chest pain and palpitations.  Gastrointestinal: Positive for diarrhea (loose stools 3-4 yesterday and 1 today). Negative for heartburn, nausea, vomiting, abdominal pain, constipation, blood in stool and abdominal  distention.  Genitourinary: Positive for frequency. Negative for urgency and flank pain.  Musculoskeletal: Positive for back pain, arthralgias and gait problem.  Skin: Positive for rash (left lateral lower leg area of mild erythematous,  warmth, mild swelling-chronic). Negative for wound.  Neurological: Negative for tremors, speech difficulty, weakness and numbness.  Psychiatric/Behavioral: Positive for confusion (at times).     Medications: Patient's Medications  New Prescriptions   No medications on file  Previous Medications   ACETAMINOPHEN (TYLENOL) 325 MG TABLET    Take 2 tablets (650 mg total) by mouth every 6 (six) hours as needed (or Fever >/= 101).   ASPIRIN (BAYER LOW STRENGTH) 81 MG EC TABLET    Take 81 mg by mouth daily.     ATORVASTATIN (LIPITOR) 20 MG TABLET    Take 20 mg by mouth daily.   BISACODYL (DULCOLAX) 10 MG SUPPOSITORY    Place 1 suppository (10 mg total) rectally daily as needed for constipation.   CALCIUM-MAGNESIUM-ZINC 333-133-5 MG TABS    Take 1 tablet by mouth 4 (four) times daily.     CHOLECALCIFEROL (VITAMIN D3) 2000 UNITS CAPSULE    Take 2,000 Units by mouth daily.     CIPROFLOXACIN (CIPRO) 500 MG TABLET    Take 1.5 tablets (750 mg total) by mouth 2 (two) times daily.   COENZYME Q10 (CO Q-10) 200 MG CAPS    Take 1 capsule by mouth daily.     DESLORATADINE (CLARINEX) 5 MG TABLET    Take  5 mg by mouth daily.   DOCUSATE SODIUM 100 MG CAPS    Take 100 mg by mouth 2 (two) times daily.   FEEDING SUPPLEMENT (ENSURE) PUDG    Take 1 Container by mouth 3 (three) times daily between meals.   HALOPERIDOL (HALDOL) 1 MG TABLET    Take 1 tablet (1 mg total) by mouth every 6 (six) hours as needed (agitation).   MAGNESIUM HYDROXIDE (MILK OF MAGNESIA) 400 MG/5ML SUSPENSION    Take 30 mLs by mouth daily as needed for constipation.   METOPROLOL SUCCINATE (TOPROL-XL) 25 MG 24 HR TABLET    Take 12.5 mg by mouth daily.   MULTIVITAMIN (THERAGRAN) PER TABLET    Take 1 tablet by  mouth daily.     OMEGA-3 FATTY ACIDS (FISH OIL) 500 MG CAPS    Take 1 capsule by mouth daily.     POLYETHYLENE GLYCOL (MIRALAX) POWDER    1 capful in water daily    PYRIDOXINE (VITAMIN B-6) 50 MG TABLET    Take 50 mg by mouth 2 (two) times daily.     SENNA (SENOKOT) 8.6 MG TABS    Take 2 tablets (17.2 mg total) by mouth daily as needed (constipation).   SULFAMETHOXAZOLE-TRIMETHOPRIM (BACTRIM DS) 800-160 MG PER TABLET    Take 2 tablets by mouth 2 (two) times daily.   TRAMADOL (ULTRAM) 50 MG TABLET    Take 1 tablet (50 mg total) by mouth every 6 (six) hours as needed for pain.  Modified Medications   No medications on file  Discontinued Medications   No medications on file     Physical Exam: Physical Exam  Constitutional: He is oriented to person, place, and time. He appears well-developed and well-nourished. No distress.  HENT:  Head: Normocephalic and atraumatic.  Eyes: Conjunctivae and EOM are normal. Pupils are equal, round, and reactive to light.  Neck: Normal range of motion. Neck supple. No JVD present. No thyromegaly present.  Cardiovascular: Normal rate and regular rhythm.   No murmur heard. Pulmonary/Chest: Effort normal and breath sounds normal. He has no wheezes. He has no rales.  Abdominal: Soft. Bowel sounds are normal. There is no tenderness.  Musculoskeletal: Normal range of motion.  Lymphadenopathy:    He has no cervical adenopathy.  Neurological: He is alert and oriented to person, place, and time. He displays normal reflexes. No cranial nerve deficit. He exhibits normal muscle tone. Coordination normal.  Skin: No rash noted. He is not diaphoretic. There is erythema (left lateral lower leg about a palm size area of mild erythema, sweeling , warmth chronic).  Psychiatric: His speech is normal. He is slowed. He is not agitated, not aggressive, not hyperactive, not withdrawn, not actively hallucinating and not combative. Thought content is delusional. Thought content is not  paranoid. Cognition and memory are not impaired. He does not express impulsivity or inappropriate judgment. He exhibits a depressed mood (sad facial looks and depends on assistance for personal care). He exhibits normal recent memory and normal remote memory.     Filed Vitals:   05/10/12 1551  BP: 106/59  Pulse: 68  Temp: 97.3 F (36.3 C)  TempSrc: Tympanic  Resp: 20      Labs reviewed: Basic Metabolic Panel:  Recent Labs  04/54/09 01/20/12 0504 01/22/12 0445  NA 137 141 134*  K 4.7 4.2 4.2  CL 101 105 101  CO2 30 29 27   GLUCOSE 113* 90 90  BUN 29* 30* 23  CREATININE 1.26 1.06 1.09  CALCIUM 8.6 8.9 8.5  TSH  --  0.998  --     Liver Function Tests:  Recent Labs  01/19/12 1800 01/22/12 0445  AST 26 22  ALT 25 17  ALKPHOS 62 56  BILITOT 0.8 0.7  PROT 6.5 5.6*  ALBUMIN 3.1* 2.4*    CBC:  Recent Labs  01/19/12 2359 01/20/12 01/20/12 0504  WBC 6.9 6.4 6.7  HGB 11.1* 11.0* 11.2*  HCT 32.2* 32.6* 33.6*  MCV 94.7 95.3 94.6  PLT 144* 146* 149*    Anemia Panel: No results found for this basename: IRON, FOLATE, VITAMINB12,  in the last 8760 hours  Significant Diagnostic Results:     Assessment/Plan Depression Feeling down and sad, c/o his health is failing, losing weight:  weighted #[phone] recently, was 122 before, not sleeping well at night. Will tray Remeron 7.5mg  nightly, check CBC/CMP/TSH next lab.   Edema LLE--not on diuretic--stopped while in hospital early this year    Unspecified hypothyroidism Takes Levothyroxine , will update TSH  ESOPHAGEAL STRICTURE   04/20/12 fluoroscope esophagus  IMPRESSION:  1. Persistent area of smooth focal narrowing in the mid distal  esophagus. This is likely a benign stricture. Endoscopic  evaluation may be helpful for further evaluation and treatment.  2. Mild narrowing just above the esophageal vestibule.  3. Small hiatal hernia.  4. Esophageal dysmotility. F/u GI--no further choking episodes,  he tolerated modified diet so far.    HYPOTENSION Resolved. Currently on Metoprolol 12.5mg  daily      Family/ staff Communication: observe for s/s depression.    Goals of care: SNF   Labs/tests ordered CBC, CMP, TSH next lab.

## 2012-05-10 NOTE — Telephone Encounter (Signed)
, °

## 2012-05-10 NOTE — Assessment & Plan Note (Signed)
Takes Levothyroxine , will update TSH

## 2012-05-10 NOTE — Assessment & Plan Note (Signed)
LLE--not on diuretic--stopped while in hospital early this year

## 2012-05-10 NOTE — Assessment & Plan Note (Signed)
Feeling down and sad, c/o his health is failing, losing weight:  weighted #117-118 recently, was 122 before, not sleeping well at night. Will tray Remeron 7.5mg  nightly, check CBC/CMP/TSH next lab.

## 2012-05-12 LAB — CBC AND DIFFERENTIAL
HCT: 35 % — AB (ref 41–53)
Hemoglobin: 11.8 g/dL — AB (ref 13.5–17.5)

## 2012-05-12 LAB — HEPATIC FUNCTION PANEL
AST: 18 U/L (ref 14–40)
Alkaline Phosphatase: 69 U/L (ref 25–125)
Bilirubin, Total: 0.4 mg/dL

## 2012-05-12 LAB — BASIC METABOLIC PANEL: Sodium: 138 mmol/L (ref 137–147)

## 2012-05-24 ENCOUNTER — Non-Acute Institutional Stay (SKILLED_NURSING_FACILITY): Payer: Medicare Other | Admitting: Nurse Practitioner

## 2012-05-24 DIAGNOSIS — F329 Major depressive disorder, single episode, unspecified: Secondary | ICD-10-CM

## 2012-05-24 DIAGNOSIS — R609 Edema, unspecified: Secondary | ICD-10-CM

## 2012-05-24 DIAGNOSIS — K5900 Constipation, unspecified: Secondary | ICD-10-CM

## 2012-05-24 DIAGNOSIS — R600 Localized edema: Secondary | ICD-10-CM

## 2012-05-24 DIAGNOSIS — E039 Hypothyroidism, unspecified: Secondary | ICD-10-CM

## 2012-05-24 NOTE — Assessment & Plan Note (Signed)
LLE--not on diuretic--stopped while in hospital early this year. Weight #119Ibs 05/16/12, 05/14/12, 05/13/12, 05/12/12, 05/11/12, 05/05/12. BNP 78.2 05/09/12. Will continue daily weight--prn Furosemide 20mg  daily for wt >#2-3 Ibs/24hrs and 3-5Ibs/week.

## 2012-05-24 NOTE — Assessment & Plan Note (Signed)
Stable on MiraLax.   

## 2012-05-24 NOTE — Progress Notes (Signed)
Patient ID: [name], male   DOB: 14-Jan-1916, 79 y.o.   MRN: [id number]  Chief Complaint:  Chief Complaint  Patient presents with  . Medical Managment of Chronic Issues    left lower leg/foot edema.      HPI:   Problem List Items Addressed This Visit   Lower extremity edema - Primary (Chronic)     LLE--not on diuretic--stopped while in hospital early this year. Weight #119Ibs 05/16/12, 05/14/12, 05/13/12, 05/12/12, 05/11/12, 05/05/12. BNP 78.2 05/09/12. Will continue daily weight--prn Furosemide 20mg  daily for wt >#2-3 Ibs/24hrs and 3-5Ibs/week.         Depression (Chronic)     Feeling down and sad, c/o his health is failing, losing weight:  weighted #782-956 recently, was 122 before, not sleeping well at night. Will tray Remeron 7.5mg  nightly--pending POA consent.       CONSTIPATION     Stable on MiraLax.       Unspecified hypothyroidism     Takes Levothyroxine , last TSH 1.34 05/12/12         Review of Systems:  Review of Systems  Constitutional: Positive for fatigue. Negative for fever, chills and appetite change.  HENT: Negative.   Eyes: Negative for pain and visual disturbance.  Respiratory: Negative for cough, shortness of breath and wheezing.   Cardiovascular: Positive for leg swelling (s/p CABG 1985, left lower leg showed multiple surgical clips. Edema in ankles -most in the left ankle and lower leg with latearl aspect mild erythema/warmth). Negative for chest pain and palpitations.  Gastrointestinal: Negative for heartburn, nausea, vomiting, abdominal pain, diarrhea (loose stools 3-4 yesterday and 1 today), constipation, blood in stool and abdominal distention.  Genitourinary: Positive for frequency. Negative for urgency and flank pain.  Musculoskeletal: Positive for back pain, arthralgias and gait problem.  Skin: Negative for rash (left lateral lower leg area of mild erythematous,  warmth, mild swelling-chronic) and wound.  Neurological: Negative for tremors,  speech difficulty, weakness and numbness.  Psychiatric/Behavioral: Positive for confusion (at times).     Medications: Patient's Medications  New Prescriptions   No medications on file  Previous Medications   ACETAMINOPHEN (TYLENOL) 325 MG TABLET    Take 2 tablets (650 mg total) by mouth every 6 (six) hours as needed (or Fever >/= 101).   ASPIRIN (BAYER LOW STRENGTH) 81 MG EC TABLET    Take 81 mg by mouth daily.     ATORVASTATIN (LIPITOR) 20 MG TABLET    Take 20 mg by mouth daily.   BISACODYL (DULCOLAX) 10 MG SUPPOSITORY    Place 1 suppository (10 mg total) rectally daily as needed for constipation.   CALCIUM-MAGNESIUM-ZINC 333-133-5 MG TABS    Take 1 tablet by mouth 4 (four) times daily.     CHOLECALCIFEROL (VITAMIN D3) 2000 UNITS CAPSULE    Take 2,000 Units by mouth daily.     CIPROFLOXACIN (CIPRO) 500 MG TABLET    Take 1.5 tablets (750 mg total) by mouth 2 (two) times daily.   COENZYME Q10 (CO Q-10) 200 MG CAPS    Take 1 capsule by mouth daily.     DESLORATADINE (CLARINEX) 5 MG TABLET    Take 5 mg by mouth daily.   DOCUSATE SODIUM 100 MG CAPS    Take 100 mg by mouth 2 (two) times daily.   FEEDING SUPPLEMENT (ENSURE) PUDG    Take 1 Container by mouth 3 (three) times daily between meals.   HALOPERIDOL (HALDOL) 1 MG TABLET    Take 1 tablet (  1 mg total) by mouth every 6 (six) hours as needed (agitation).   MAGNESIUM HYDROXIDE (MILK OF MAGNESIA) 400 MG/5ML SUSPENSION    Take 30 mLs by mouth daily as needed for constipation.   METOPROLOL SUCCINATE (TOPROL-XL) 25 MG 24 HR TABLET    Take 12.5 mg by mouth daily.   MULTIVITAMIN (THERAGRAN) PER TABLET    Take 1 tablet by mouth daily.     OMEGA-3 FATTY ACIDS (FISH OIL) 500 MG CAPS    Take 1 capsule by mouth daily.     POLYETHYLENE GLYCOL (MIRALAX) POWDER    1 capful in water daily    PYRIDOXINE (VITAMIN B-6) 50 MG TABLET    Take 50 mg by mouth 2 (two) times daily.     SENNA (SENOKOT) 8.6 MG TABS    Take 2 tablets (17.2 mg total) by mouth daily as  needed (constipation).   SULFAMETHOXAZOLE-TRIMETHOPRIM (BACTRIM DS) 800-160 MG PER TABLET    Take 2 tablets by mouth 2 (two) times daily.   TRAMADOL (ULTRAM) 50 MG TABLET    Take 1 tablet (50 mg total) by mouth every 6 (six) hours as needed for pain.  Modified Medications   No medications on file  Discontinued Medications   No medications on file     Physical Exam: Physical Exam  Constitutional: He is oriented to person, place, and time. He appears well-developed and well-nourished. No distress.  HENT:  Head: Normocephalic and atraumatic.  Eyes: Conjunctivae and EOM are normal. Pupils are equal, round, and reactive to light.  Neck: Normal range of motion. Neck supple. No JVD present. No thyromegaly present.  Cardiovascular: Normal rate and regular rhythm.   No murmur heard. Pulmonary/Chest: Effort normal and breath sounds normal. He has no wheezes. He has no rales.  Abdominal: Soft. Bowel sounds are normal. There is no tenderness.  Musculoskeletal: Normal range of motion. Edema: LLE.  Lymphadenopathy:    He has no cervical adenopathy.  Neurological: He is alert and oriented to person, place, and time. He displays normal reflexes. No cranial nerve deficit. He exhibits normal muscle tone. Coordination normal.  Skin: No rash noted. He is not diaphoretic. There is erythema (left lateral lower leg about a palm size area of mild erythema, sweeling , warmth chronic).  Psychiatric: His speech is normal. He is slowed. He is not agitated, not aggressive, not hyperactive, not withdrawn, not actively hallucinating and not combative. Thought content is delusional. Thought content is not paranoid. Cognition and memory are not impaired. He does not express impulsivity or inappropriate judgment. He exhibits a depressed mood (sad facial looks and depends on assistance for personal care). He exhibits normal recent memory and normal remote memory.     Filed Vitals:   05/24/12 1401  BP: 120/78  Pulse:  69  Temp: 98.3 F (36.8 C)  TempSrc: Tympanic  Resp: 20      Labs reviewed: Basic Metabolic Panel:  Recent Labs  29/52/84 01/20/12 0504 01/22/12 0445 05/12/12  NA 137 141 134* 138  K 4.7 4.2 4.2 4.4  CL 101 105 101  --   CO2 30 29 27   --   GLUCOSE 113* 90 90  --   BUN 29* 30* 23 23*  CREATININE 1.26 1.06 1.09 0.9  CALCIUM 8.6 8.9 8.5  --   TSH  --  0.998  --  1.34    Liver Function Tests:  Recent Labs  01/19/12 1800 01/22/12 0445 05/12/12  AST 26 22 18   ALT 25 17  16  ALKPHOS 62 56 69  BILITOT 0.8 0.7  --   PROT 6.5 5.6*  --   ALBUMIN 3.1* 2.4*  --     CBC:  Recent Labs  01/19/12 2359 01/20/12 01/20/12 0504 05/12/12  WBC 6.9 6.4 6.7 6.2  HGB 11.1* 11.0* 11.2* 11.8*  HCT 32.2* 32.6* 33.6* 35*  MCV 94.7 95.3 94.6  --   PLT 144* 146* 149* 204    Anemia Panel: No results found for this basename: IRON, FOLATE, VITAMINB12,  in the last 8760 hours  Significant Diagnostic Results:     Assessment/Plan Lower extremity edema LLE--not on diuretic--stopped while in hospital early this year. Weight #119Ibs 05/16/12, 05/14/12, 05/13/12, 05/12/12, 05/11/12, 05/05/12. BNP 78.2 05/09/12. Will continue daily weight--prn Furosemide 20mg  daily for wt >#2-3 Ibs/24hrs and 3-5Ibs/week.       Depression Feeling down and sad, c/o his health is failing, losing weight:  weighted #161-096 recently, was 122 before, not sleeping well at night. Will tray Remeron 7.5mg  nightly--pending POA consent.     Unspecified hypothyroidism Takes Levothyroxine , last TSH 1.34 05/12/12    CONSTIPATION Stable on MiraLax.        Family/ staff Communication: monitor weight   Goals of care: SNF   Labs/tests ordered none.

## 2012-05-24 NOTE — Assessment & Plan Note (Signed)
Takes Levothyroxine 25mcg, last TSH 1.34 05/12/12                 

## 2012-05-24 NOTE — Assessment & Plan Note (Signed)
Feeling down and sad, c/o his health is failing, losing weight:  weighted #117-118 recently, was 122 before, not sleeping well at night. Will tray Remeron 7.5mg  nightly--pending POA consent.

## 2012-05-25 ENCOUNTER — Encounter: Payer: Self-pay | Admitting: Gastroenterology

## 2012-05-25 ENCOUNTER — Ambulatory Visit (INDEPENDENT_AMBULATORY_CARE_PROVIDER_SITE_OTHER): Payer: Medicare Other | Admitting: Gastroenterology

## 2012-05-25 VITALS — BP 90/48 | HR 64 | Ht 66.0 in | Wt 125.0 lb

## 2012-05-25 DIAGNOSIS — K222 Esophageal obstruction: Secondary | ICD-10-CM

## 2012-05-25 NOTE — Patient Instructions (Addendum)
Follow up as needed

## 2012-05-25 NOTE — Progress Notes (Signed)
History of Present Illness:  Patient has returned following barium swallow. This demonstrated a mid esophageal stricture. By taking small bites and cutting his food carefully he is able to eat without much difficulty. He's had no further choking spells. He has some difficulty swallowing pills. Weight has increased.    Review of Systems: Pertinent positive and negative review of systems were noted in the above HPI section. All other review of systems were otherwise negative.    Current Medications, Allergies, Past Medical History, Past Surgical History, Family History and Social History were reviewed in [employer] electronic medical record  Vital signs were reviewed in today's medical record. Physical Exam: General: He is an elderly male in no acute distress

## 2012-05-25 NOTE — Assessment & Plan Note (Addendum)
Barium swallow demonstrates a midesophageal benign appearing stricture. While this is causing some dysphagia, he is managing fairly well by eating small bites. In view of his advanced age and comorbidities no interventional therapy will be done. Should dysphagia worsen to the point where he is either aspirating or losing weight then I would consider dilatation therapy.

## 2012-05-30 ENCOUNTER — Encounter: Payer: Self-pay | Admitting: Internal Medicine

## 2012-05-30 ENCOUNTER — Non-Acute Institutional Stay (SKILLED_NURSING_FACILITY): Payer: Medicare Other | Admitting: Internal Medicine

## 2012-05-30 DIAGNOSIS — R600 Localized edema: Secondary | ICD-10-CM

## 2012-05-30 DIAGNOSIS — R1314 Dysphagia, pharyngoesophageal phase: Secondary | ICD-10-CM

## 2012-05-30 DIAGNOSIS — F0390 Unspecified dementia without behavioral disturbance: Secondary | ICD-10-CM

## 2012-05-30 DIAGNOSIS — R609 Edema, unspecified: Secondary | ICD-10-CM

## 2012-05-30 DIAGNOSIS — K222 Esophageal obstruction: Secondary | ICD-10-CM

## 2012-05-30 DIAGNOSIS — R5381 Other malaise: Secondary | ICD-10-CM

## 2012-05-30 DIAGNOSIS — R269 Unspecified abnormalities of gait and mobility: Secondary | ICD-10-CM

## 2012-05-30 DIAGNOSIS — L0390 Cellulitis, unspecified: Secondary | ICD-10-CM

## 2012-05-30 DIAGNOSIS — I872 Venous insufficiency (chronic) (peripheral): Secondary | ICD-10-CM
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
## 2012-06-03 ENCOUNTER — Non-Acute Institutional Stay (SKILLED_NURSING_FACILITY): Payer: Medicare Other | Admitting: Nurse Practitioner

## 2012-06-03 DIAGNOSIS — F329 Major depressive disorder, single episode, unspecified: Secondary | ICD-10-CM

## 2012-06-03 DIAGNOSIS — E785 Hyperlipidemia, unspecified: Secondary | ICD-10-CM

## 2012-06-03 DIAGNOSIS — E039 Hypothyroidism, unspecified: Secondary | ICD-10-CM

## 2012-06-03 DIAGNOSIS — R1314 Dysphagia, pharyngoesophageal phase: Secondary | ICD-10-CM

## 2012-06-03 DIAGNOSIS — F0390 Unspecified dementia without behavioral disturbance: Secondary | ICD-10-CM

## 2012-06-03 DIAGNOSIS — R600 Localized edema: Secondary | ICD-10-CM

## 2012-06-03 DIAGNOSIS — R609 Edema, unspecified: Secondary | ICD-10-CM

## 2012-06-03 DIAGNOSIS — K5900 Constipation, unspecified: Secondary | ICD-10-CM

## 2012-06-03 NOTE — Assessment & Plan Note (Signed)
Stable on MiraLax.   

## 2012-06-03 NOTE — Assessment & Plan Note (Signed)
Feeling down and sad, c/o his health is failing, losing weight:  weighted #117-118 recently, was 122 before, not sleeping well at night. Will tray Remeron 7.5mg  nightly--pending POA consent-pending.

## 2012-06-03 NOTE — Assessment & Plan Note (Signed)
Takes Lipitor 20mg 

## 2012-06-03 NOTE — Assessment & Plan Note (Signed)
LLE--not on diuretic--stopped while in hospital early this year. Weight #119Ibs 05/16/12, 05/14/12, 05/13/12, 05/12/12, 05/11/12, 05/05/12. BNP 78.2 05/09/12. Will continue daily weight--prn Furosemide 20mg daily for wt >#2-3 Ibs/24hrs and 3-5Ibs/week.      

## 2012-06-03 NOTE — Assessment & Plan Note (Signed)
Takes Levothyroxine 25mcg, last TSH 1.34 05/12/12                 

## 2012-06-03 NOTE — Assessment & Plan Note (Signed)
Slow mentation but I believe his cognition is adequate in AL setting.  

## 2012-06-03 NOTE — Progress Notes (Signed)
Patient ID: [name], male   DOB: 1916-11-19, 79 y.o.   MRN: [id number]  Chief Complaint:  Chief Complaint  Patient presents with  . Medical Managment of Chronic Issues    dysphagia     HPI:    Problem List Items Addressed This Visit   Dysphagia, pharyngoesophageal phase (Chronic)       04/20/12 fluoroscope esophagus  IMPRESSION:  1. Persistent area of smooth focal narrowing in the mid distal  esophagus. This is likely a benign stricture. Endoscopic  evaluation may be helpful for further evaluation and treatment.  2. Mild narrowing just above the esophageal vestibule.  3. Small hiatal hernia.  4. Esophageal dysmotility. F/u GI--no further choking episodes, he tolerated modified diet so far.   ST to evaluate and recommend diet modification since the patient requested for thin liquid-he should be able to make an informed decision regarding her diet.  Dc All Vitamins and dietary supplement per POA's request due to dysphagia.      Lower extremity edema - Primary (Chronic)     LLE--not on diuretic--stopped while in hospital early this year. Weight #119Ibs 05/16/12, 05/14/12, 05/13/12, 05/12/12, 05/11/12, 05/05/12. BNP 78.2 05/09/12. Will continue daily weight--prn Furosemide 20mg  daily for wt >#2-3 Ibs/24hrs and 3-5Ibs/week.           Senile dementia, uncomplicated (Chronic)     Slow mentation but I believe his cognition is adequate in AL setting.      Depression (Chronic)     Feeling down and sad, c/o his health is failing, losing weight:  weighted #409-811 recently, was 122 before, not sleeping well at night. Will tray Remeron 7.5mg  nightly--pending POA consent-pending.         HYPERLIPIDEMIA     Takes Lipitor 20mg     CONSTIPATION     Stable on MiraLax.         Unspecified hypothyroidism     Takes Levothyroxine , last TSH 1.34 05/12/12           Review of Systems: Review of Systems  Constitutional: Positive for fatigue. Negative for fever, chills and  appetite change.  HENT: Negative.   Eyes: Negative for pain and visual disturbance.  Respiratory: Negative for cough, shortness of breath and wheezing.   Cardiovascular: Positive for leg swelling (s/p CABG 1985, left lower leg showed multiple surgical clips. Edema in ankles -most in the left ankle and lower leg with latearl aspect mild erythema/warmth). Negative for chest pain and palpitations.  Gastrointestinal: Negative for heartburn, nausea, vomiting, abdominal pain, diarrhea (loose stools 3-4 yesterday and 1 today), constipation, blood in stool and abdominal distention.  Genitourinary: Positive for frequency. Negative for urgency and flank pain.  Musculoskeletal: Positive for back pain, arthralgias and gait problem.  Skin: Negative for rash (left lateral lower leg area of mild erythematous,  warmth, mild swelling-chronic) and wound.  Neurological: Negative for tremors, speech difficulty, weakness and numbness.  Psychiatric/Behavioral: Positive for confusion (at times).     Medications: Patient's Medications  New Prescriptions   No medications on file  Previous Medications   ACETAMINOPHEN (TYLENOL) 325 MG TABLET    Take 2 tablets (650 mg total) by mouth every 6 (six) hours as needed (or Fever >/= 101).   ASPIRIN (BAYER LOW STRENGTH) 81 MG EC TABLET    Take 81 mg by mouth daily.     ATORVASTATIN (LIPITOR) 20 MG TABLET    Take 20 mg by mouth daily.   BISACODYL (DULCOLAX) 10 MG SUPPOSITORY    Place  1 suppository (10 mg total) rectally daily as needed for constipation.   CALCIUM-MAGNESIUM-ZINC 333-133-5 MG TABS    Take 1 tablet by mouth 4 (four) times daily.     CHOLECALCIFEROL (VITAMIN D3) 2000 UNITS CAPSULE    Take 2,000 Units by mouth daily.     DESLORATADINE (CLARINEX) 5 MG TABLET    Take 5 mg by mouth daily.   DOCUSATE SODIUM 100 MG CAPS    Take 100 mg by mouth 2 (two) times daily.   FEEDING SUPPLEMENT (ENSURE) PUDG    Take 1 Container by mouth 3 (three) times daily between meals.    GUAIFENESIN (MUCINEX) 600 MG 12 HR TABLET    Take 1,200 mg by mouth 2 (two) times daily.   HALOPERIDOL (HALDOL) 1 MG TABLET    Take 1 tablet (1 mg total) by mouth every 6 (six) hours as needed (agitation).   MAGNESIUM HYDROXIDE (MILK OF MAGNESIA) 400 MG/5ML SUSPENSION    Take 30 mLs by mouth daily as needed for constipation.   METOPROLOL SUCCINATE (TOPROL-XL) 25 MG 24 HR TABLET    Take 12.5 mg by mouth daily.   MULTIVITAMIN (THERAGRAN) PER TABLET    Take 1 tablet by mouth daily.     OMEGA-3 FATTY ACIDS (FISH OIL) 500 MG CAPS    Take 1 capsule by mouth daily.     POLYETHYLENE GLYCOL (MIRALAX) POWDER    1 capful in water daily    PYRIDOXINE (VITAMIN B-6) 50 MG TABLET    Take 50 mg by mouth 2 (two) times daily.     SENNA (SENOKOT) 8.6 MG TABS    Take 2 tablets (17.2 mg total) by mouth daily as needed (constipation).   SULFAMETHOXAZOLE-TRIMETHOPRIM (BACTRIM DS) 800-160 MG PER TABLET    Take 2 tablets by mouth 2 (two) times daily.   TRAMADOL (ULTRAM) 50 MG TABLET    Take 1 tablet (50 mg total) by mouth every 6 (six) hours as needed for pain.  Modified Medications   No medications on file  Discontinued Medications   No medications on file     Physical Exam: Physical Exam  Constitutional: He is oriented to person, place, and time. He appears well-developed and well-nourished. No distress.  HENT:  Head: Normocephalic and atraumatic.  Eyes: Conjunctivae and EOM are normal. Pupils are equal, round, and reactive to light.  Neck: Normal range of motion. Neck supple. No JVD present. No thyromegaly present.  Cardiovascular: Normal rate and regular rhythm.   No murmur heard. Pulmonary/Chest: Effort normal and breath sounds normal. He has no wheezes. He has no rales.  Abdominal: Soft. Bowel sounds are normal. There is no tenderness.  Musculoskeletal: Normal range of motion. Edema: LLE.  Lymphadenopathy:    He has no cervical adenopathy.  Neurological: He is alert and oriented to person, place, and  time. He displays normal reflexes. No cranial nerve deficit. He exhibits normal muscle tone. Coordination normal.  Skin: No rash noted. He is not diaphoretic. There is erythema (left lateral lower leg about a palm size area of mild erythema, sweeling , warmth chronic).  Psychiatric: His speech is normal. He is slowed. He is not agitated, not aggressive, not hyperactive, not withdrawn, not actively hallucinating and not combative. Thought content is delusional. Thought content is not paranoid. Cognition and memory are not impaired. He does not express impulsivity or inappropriate judgment. He exhibits a depressed mood (sad facial looks and depends on assistance for personal care). He exhibits normal recent memory and normal remote  memory.     Filed Vitals:   06/03/12 1137  BP: 120/59  Pulse: 55  Temp: 97.7 F (36.5 C)  TempSrc: Tympanic  Resp: 22      Labs reviewed: Basic Metabolic Panel:  Recent Labs  09/81/19 01/20/12 0504 01/22/12 0445 05/12/12  NA 137 141 134* 138  K 4.7 4.2 4.2 4.4  CL 101 105 101  --   CO2 30 29 27   --   GLUCOSE 113* 90 90  --   BUN 29* 30* 23 23*  CREATININE 1.26 1.06 1.09 0.9  CALCIUM 8.6 8.9 8.5  --   TSH  --  0.998  --  1.34    Liver Function Tests:  Recent Labs  01/19/12 1800 01/22/12 0445 05/12/12  AST 26 22 18   ALT 25 17 16   ALKPHOS 62 56 69  BILITOT 0.8 0.7  --   PROT 6.5 5.6*  --   ALBUMIN 3.1* 2.4*  --     CBC:  Recent Labs  01/19/12 2359 01/20/12 01/20/12 0504 05/12/12  WBC 6.9 6.4 6.7 6.2  HGB 11.1* 11.0* 11.2* 11.8*  HCT 32.2* 32.6* 33.6* 35*  MCV 94.7 95.3 94.6  --   PLT 144* 146* 149* 204    Anemia Panel: No results found for this basename: IRON, FOLATE, VITAMINB12,  in the last 8760 hours  Significant Diagnostic Results:     Assessment/Plan Lower extremity edema LLE--not on diuretic--stopped while in hospital early this year. Weight #119Ibs 05/16/12, 05/14/12, 05/13/12, 05/12/12, 05/11/12, 05/05/12. BNP 78.2 05/09/12.  Will continue daily weight--prn Furosemide 20mg  daily for wt >#2-3 Ibs/24hrs and 3-5Ibs/week.         Senile dementia, uncomplicated Slow mentation but I believe his cognition is adequate in AL setting.    Depression Feeling down and sad, c/o his health is failing, losing weight:  weighted #[phone] recently, was 122 before, not sleeping well at night. Will tray Remeron 7.5mg  nightly--pending POA consent-pending.       Unspecified hypothyroidism Takes Levothyroxine , last TSH 1.34 05/12/12      Dysphagia, pharyngoesophageal phase   04/20/12 fluoroscope esophagus  IMPRESSION:  1. Persistent area of smooth focal narrowing in the mid distal  esophagus. This is likely a benign stricture. Endoscopic  evaluation may be helpful for further evaluation and treatment.  2. Mild narrowing just above the esophageal vestibule.  3. Small hiatal hernia.  4. Esophageal dysmotility. F/u GI--no further choking episodes, he tolerated modified diet so far.   ST to evaluate and recommend diet modification since the patient requested for thin liquid-he should be able to make an informed decision regarding her diet.  Dc All Vitamins and dietary supplement per POA's request due to dysphagia.    HYPERLIPIDEMIA Takes Lipitor 20mg   CONSTIPATION Stable on MiraLax.           Family/ staff Communication:  Monitor for dysphagia.    Goals of care: SNF if his physical condition improves/    Labs/tests ordered none

## 2012-06-03 NOTE — Assessment & Plan Note (Signed)
  04/20/12 fluoroscope esophagus  IMPRESSION:  1. Persistent area of smooth focal narrowing in the mid distal  esophagus. This is likely a benign stricture. Endoscopic  evaluation may be helpful for further evaluation and treatment.  2. Mild narrowing just above the esophageal vestibule.  3. Small hiatal hernia.  4. Esophageal dysmotility. F/u GI--no further choking episodes, he tolerated modified diet so far.   ST to evaluate and recommend diet modification since the patient requested for thin liquid-he should be able to make an informed decision regarding her diet.  Dc All Vitamins and dietary supplement per POA's request due to dysphagia.

## 2012-06-14 ENCOUNTER — Non-Acute Institutional Stay (SKILLED_NURSING_FACILITY): Payer: Medicare Other | Admitting: Nurse Practitioner

## 2012-06-14 DIAGNOSIS — R319 Hematuria, unspecified: Secondary | ICD-10-CM

## 2012-06-14 DIAGNOSIS — K5900 Constipation, unspecified: Secondary | ICD-10-CM

## 2012-06-14 DIAGNOSIS — E039 Hypothyroidism, unspecified: Secondary | ICD-10-CM

## 2012-06-14 DIAGNOSIS — I2510 Atherosclerotic heart disease of native coronary artery without angina pectoris: Secondary | ICD-10-CM

## 2012-06-14 NOTE — Progress Notes (Signed)
Patient ID: [name], male   DOB: 04-04-1916, 79 y.o.   MRN: [id number]  Chief Complaint:  Chief Complaint  Patient presents with  . Medical Managment of Chronic Issues    hematuria     HPI:   Problem List Items Addressed This Visit   CONSTIPATION     Stable on MiraLax and Colace bid.           Coronary artery disease     Takes Metoprolol 12.5mg , Lipitor 20mg , ASA    Unspecified hypothyroidism     Takes Levothyroxine , last TSH 1.34 05/12/12          Hematuria - Primary     Blood urine this am, c/o burning sensation with urination, denied suprapubic or CVA tenderness. He stated he had blood urine a long time ago which was related to urinary tract infection. Will obtain Cath UA and C/S, CBC, CMP. Observe the patient.        Review of Systems:  Review of Systems  Constitutional: Positive for fatigue. Negative for fever, chills and appetite change.  HENT: Negative.   Eyes: Negative for pain and visual disturbance.  Respiratory: Negative for cough, shortness of breath and wheezing.   Cardiovascular: Positive for leg swelling (s/p CABG 1985, left lower leg showed multiple surgical clips. Edema in ankles -most in the left ankle and lower leg with latearl aspect mild erythema/warmth). Negative for chest pain and palpitations.  Gastrointestinal: Negative for heartburn, nausea, vomiting, abdominal pain, diarrhea (loose stools 3-4 yesterday and 1 today), constipation, blood in stool and abdominal distention.  Genitourinary: Positive for frequency and hematuria. Negative for urgency and flank pain.  Musculoskeletal: Positive for back pain, arthralgias and gait problem.  Skin: Negative for rash (left lateral lower leg area of mild erythematous,  warmth, mild swelling-chronic) and wound.  Neurological: Negative for tremors, speech difficulty, weakness and numbness.  Psychiatric/Behavioral: Positive for confusion (at times).     Medications: Patient's Medications   New Prescriptions   No medications on file  Previous Medications   ACETAMINOPHEN (TYLENOL) 325 MG TABLET    Take 2 tablets (650 mg total) by mouth every 6 (six) hours as needed (or Fever >/= 101).   ASPIRIN (BAYER LOW STRENGTH) 81 MG EC TABLET    Take 81 mg by mouth daily.     ATORVASTATIN (LIPITOR) 20 MG TABLET    Take 20 mg by mouth daily.   BISACODYL (DULCOLAX) 10 MG SUPPOSITORY    Place 1 suppository (10 mg total) rectally daily as needed for constipation.   CALCIUM-MAGNESIUM-ZINC 333-133-5 MG TABS    Take 1 tablet by mouth 4 (four) times daily.     CHOLECALCIFEROL (VITAMIN D3) 2000 UNITS CAPSULE    Take 2,000 Units by mouth daily.     DESLORATADINE (CLARINEX) 5 MG TABLET    Take 5 mg by mouth daily.   DOCUSATE SODIUM 100 MG CAPS    Take 100 mg by mouth 2 (two) times daily.   FEEDING SUPPLEMENT (ENSURE) PUDG    Take 1 Container by mouth 3 (three) times daily between meals.   GUAIFENESIN (MUCINEX) 600 MG 12 HR TABLET    Take 1,200 mg by mouth 2 (two) times daily.   HALOPERIDOL (HALDOL) 1 MG TABLET    Take 1 tablet (1 mg total) by mouth every 6 (six) hours as needed (agitation).   MAGNESIUM HYDROXIDE (MILK OF MAGNESIA) 400 MG/5ML SUSPENSION    Take 30 mLs by mouth daily as needed for constipation.  METOPROLOL SUCCINATE (TOPROL-XL) 25 MG 24 HR TABLET    Take 12.5 mg by mouth daily.   MULTIVITAMIN (THERAGRAN) PER TABLET    Take 1 tablet by mouth daily.     OMEGA-3 FATTY ACIDS (FISH OIL) 500 MG CAPS    Take 1 capsule by mouth daily.     POLYETHYLENE GLYCOL (MIRALAX) POWDER    1 capful in water daily    PYRIDOXINE (VITAMIN B-6) 50 MG TABLET    Take 50 mg by mouth 2 (two) times daily.     SENNA (SENOKOT) 8.6 MG TABS    Take 2 tablets (17.2 mg total) by mouth daily as needed (constipation).   SULFAMETHOXAZOLE-TRIMETHOPRIM (BACTRIM DS) 800-160 MG PER TABLET    Take 2 tablets by mouth 2 (two) times daily.   TRAMADOL (ULTRAM) 50 MG TABLET    Take 1 tablet (50 mg total) by mouth every 6 (six) hours  as needed for pain.  Modified Medications   No medications on file  Discontinued Medications   No medications on file     Physical Exam: Physical Exam  Constitutional: He is oriented to person, place, and time. He appears well-developed and well-nourished. No distress.  HENT:  Head: Normocephalic and atraumatic.  Eyes: Conjunctivae and EOM are normal. Pupils are equal, round, and reactive to light.  Neck: Normal range of motion. Neck supple. No JVD present. No thyromegaly present.  Cardiovascular: Normal rate and regular rhythm.   No murmur heard. Pulmonary/Chest: Effort normal and breath sounds normal. He has no wheezes. He has no rales.  Abdominal: Soft. Bowel sounds are normal. There is no tenderness.  Genitourinary: Rectum normal and penis normal. No penile tenderness.  Musculoskeletal: Normal range of motion. Edema: LLE.  Lymphadenopathy:    He has no cervical adenopathy.  Neurological: He is alert and oriented to person, place, and time. He displays normal reflexes. No cranial nerve deficit. He exhibits normal muscle tone. Coordination normal.  Skin: No rash noted. He is not diaphoretic. There is erythema (left lateral lower leg about a palm size area of mild erythema, sweeling , warmth chronic).  Psychiatric: His speech is normal. He is slowed. He is not agitated, not aggressive, not hyperactive, not withdrawn, not actively hallucinating and not combative. Thought content is delusional. Thought content is not paranoid. Cognition and memory are not impaired. He does not express impulsivity or inappropriate judgment. He exhibits a depressed mood (sad facial looks and depends on assistance for personal care). He exhibits normal recent memory and normal remote memory.     Filed Vitals:   06/14/12 1045  BP: 104/56  Pulse: 72  Temp: 98.7 F (37.1 C)  TempSrc: Tympanic  Resp: 18      Labs reviewed: Basic Metabolic Panel:  Recent Labs  16/10/96 01/20/12 0504  01/22/12 0445 05/12/12  NA 137 141 134* 138  K 4.7 4.2 4.2 4.4  CL 101 105 101  --   CO2 30 29 27   --   GLUCOSE 113* 90 90  --   BUN 29* 30* 23 23*  CREATININE 1.26 1.06 1.09 0.9  CALCIUM 8.6 8.9 8.5  --   TSH  --  0.998  --  1.34    Liver Function Tests:  Recent Labs  01/19/12 1800 01/22/12 0445 05/12/12  AST 26 22 18   ALT 25 17 16   ALKPHOS 62 56 69  BILITOT 0.8 0.7  --   PROT 6.5 5.6*  --   ALBUMIN 3.1* 2.4*  --  CBC:  Recent Labs  01/19/12 2359 01/20/12 01/20/12 0504 05/12/12  WBC 6.9 6.4 6.7 6.2  HGB 11.1* 11.0* 11.2* 11.8*  HCT 32.2* 32.6* 33.6* 35*  MCV 94.7 95.3 94.6  --   PLT 144* 146* 149* 204    Anemia Panel: No results found for this basename: IRON, FOLATE, VITAMINB12,  in the last 8760 hours  Significant Diagnostic Results:     Assessment/Plan Hematuria Blood urine this am, c/o burning sensation with urination, denied suprapubic or CVA tenderness. He stated he had blood urine a long time ago which was related to urinary tract infection. Will obtain Cath UA and C/S, CBC, CMP. Observe the patient.   CONSTIPATION Stable on MiraLax and Colace bid.         Unspecified hypothyroidism Takes Levothyroxine , last TSH 1.34 05/12/12        Coronary artery disease Takes Metoprolol 12.5mg , Lipitor 20mg , ASA     Family/ staff Communication: observe the patient.    Goals of care: SNF   Labs/tests ordered CBC, CMP, Cath UA and C/S

## 2012-06-14 NOTE — Assessment & Plan Note (Signed)
Stable on MiraLax and Colace bid.

## 2012-06-14 NOTE — Assessment & Plan Note (Signed)
Takes Metoprolol 12.5mg , Lipitor 20mg , ASA

## 2012-06-14 NOTE — Assessment & Plan Note (Signed)
Blood urine this am, c/o burning sensation with urination, denied suprapubic or CVA tenderness. He stated he had blood urine a long time ago which was related to urinary tract infection. Will obtain Cath UA and C/S, CBC, CMP. Observe the patient.

## 2012-06-14 NOTE — Assessment & Plan Note (Signed)
Takes Levothyroxine 25mcg, last TSH 1.34 05/12/12                 

## 2012-06-15 LAB — BASIC METABOLIC PANEL
BUN: 37 mg/dL — AB (ref 4–21)
Creatinine: 1 mg/dL (ref 0.6–1.3)

## 2012-06-15 LAB — CBC AND DIFFERENTIAL: Platelets: 217 10*3/uL (ref 150–399)

## 2012-06-15 LAB — HEPATIC FUNCTION PANEL: Alkaline Phosphatase: 70 U/L (ref 25–125)

## 2012-06-17 ENCOUNTER — Non-Acute Institutional Stay (SKILLED_NURSING_FACILITY): Payer: Medicare Other | Admitting: Nurse Practitioner

## 2012-06-17 DIAGNOSIS — Z66 Do not resuscitate: Secondary | ICD-10-CM | POA: Insufficient documentation

## 2012-06-17 DIAGNOSIS — R600 Localized edema: Secondary | ICD-10-CM

## 2012-06-17 DIAGNOSIS — N390 Urinary tract infection, site not specified: Secondary | ICD-10-CM

## 2012-06-17 DIAGNOSIS — R319 Hematuria, unspecified: Secondary | ICD-10-CM

## 2012-06-17 DIAGNOSIS — E039 Hypothyroidism, unspecified: Secondary | ICD-10-CM

## 2012-06-17 DIAGNOSIS — K5900 Constipation, unspecified: Secondary | ICD-10-CM

## 2012-06-17 DIAGNOSIS — I2510 Atherosclerotic heart disease of native coronary artery without angina pectoris: Secondary | ICD-10-CM

## 2012-06-17 DIAGNOSIS — R609 Edema, unspecified: Secondary | ICD-10-CM

## 2012-06-17 NOTE — Assessment & Plan Note (Signed)
LLE--not on diuretic--stopped while in hospital early this year. Weight #119Ibs 05/16/12, 05/14/12, 05/13/12, 05/12/12, 05/11/12, 05/05/12. BNP 78.2 05/09/12. Will continue daily weight--prn Furosemide 20mg daily for wt >#2-3 Ibs/24hrs and 3-5Ibs/week.      

## 2012-06-17 NOTE — Assessment & Plan Note (Signed)
Takes Levothyroxine , last TSH 1.34 05/12/12

## 2012-06-17 NOTE — Assessment & Plan Note (Signed)
Stable on MiraLax and Colace bid and Senna S II prn daily.

## 2012-06-17 NOTE — Progress Notes (Signed)
Patient ID: [name], male   DOB: 1916-08-27, 79 y.o.   MRN: [id number]  Chief Complaint:  Chief Complaint  Patient presents with  . Medical Managment of Chronic Issues    UTI     HPI:    Problem List Items Addressed This Visit   CONSTIPATION     Stable on MiraLax and Colace bid and Senna S II prn daily.             Coronary artery disease     Takes Metoprolol 12.5mg , Lipitor 20mg , ASA      DNR (do not resuscitate)   Hematuria     Blood urine this am, c/o burning sensation with urination, denied suprapubic or CVA tenderness--no further occurrence since ABT started, his dysuria also subsided.       Lower extremity edema (Chronic)     LLE--not on diuretic--stopped while in hospital early this year. Weight #119Ibs 05/16/12, 05/14/12, 05/13/12, 05/12/12, 05/11/12, 05/05/12. BNP 78.2 05/09/12. Will continue daily weight--prn Furosemide 20mg  daily for wt >#2-3 Ibs/24hrs and 3-5Ibs/week.             Unspecified hypothyroidism     Takes Levothyroxine , last TSH 1.34 05/12/12            Urinary tract infection, site not specified - Primary (Chronic)     Urine culture 06/16/12 P. Mirabilis>100,000c/ml-Septra DS total 10 days started 06/16/12.        Review of Systems:  Review of Systems  Constitutional: Positive for fatigue. Negative for fever, chills and appetite change.  HENT: Negative.   Eyes: Negative for pain and visual disturbance.  Respiratory: Negative for cough, shortness of breath and wheezing.   Cardiovascular: Positive for leg swelling (s/p CABG 1985, left lower leg showed multiple surgical clips. Edema in ankles -most in the left ankle and lower leg with latearl aspect mild erythema/warmth). Negative for chest pain and palpitations.  Gastrointestinal: Negative for heartburn, nausea, vomiting, abdominal pain, diarrhea (loose stools 3-4 yesterday and 1 today), constipation, blood in stool and abdominal distention.  Genitourinary: Positive for  dysuria and frequency. Negative for urgency, hematuria and flank pain.  Musculoskeletal: Positive for back pain, arthralgias and gait problem.  Skin: Negative for rash (left lateral lower leg area of mild erythematous,  warmth, mild swelling-chronic) and wound.  Neurological: Negative for tremors, speech difficulty, weakness and numbness.  Psychiatric/Behavioral: Positive for confusion (at times).     Medications: Patient's Medications  New Prescriptions   No medications on file  Previous Medications   ACETAMINOPHEN (TYLENOL) 325 MG TABLET    Take 2 tablets (650 mg total) by mouth every 6 (six) hours as needed (or Fever >/= 101).   ASPIRIN (BAYER LOW STRENGTH) 81 MG EC TABLET    Take 81 mg by mouth daily.     ATORVASTATIN (LIPITOR) 20 MG TABLET    Take 20 mg by mouth daily.   BISACODYL (DULCOLAX) 10 MG SUPPOSITORY    Place 1 suppository (10 mg total) rectally daily as needed for constipation.   CALCIUM-MAGNESIUM-ZINC 333-133-5 MG TABS    Take 1 tablet by mouth 4 (four) times daily.     CHOLECALCIFEROL (VITAMIN D3) 2000 UNITS CAPSULE    Take 2,000 Units by mouth daily.     DESLORATADINE (CLARINEX) 5 MG TABLET    Take 5 mg by mouth daily.   DOCUSATE SODIUM 100 MG CAPS    Take 100 mg by mouth 2 (two) times daily.   FEEDING SUPPLEMENT (ENSURE) PUDG  Take 1 Container by mouth 3 (three) times daily between meals.   GUAIFENESIN (MUCINEX) 600 MG 12 HR TABLET    Take 1,200 mg by mouth 2 (two) times daily.   HALOPERIDOL (HALDOL) 1 MG TABLET    Take 1 tablet (1 mg total) by mouth every 6 (six) hours as needed (agitation).   MAGNESIUM HYDROXIDE (MILK OF MAGNESIA) 400 MG/5ML SUSPENSION    Take 30 mLs by mouth daily as needed for constipation.   METOPROLOL SUCCINATE (TOPROL-XL) 25 MG 24 HR TABLET    Take 12.5 mg by mouth daily.   MULTIVITAMIN (THERAGRAN) PER TABLET    Take 1 tablet by mouth daily.     OMEGA-3 FATTY ACIDS (FISH OIL) 500 MG CAPS    Take 1 capsule by mouth daily.     POLYETHYLENE GLYCOL  (MIRALAX) POWDER    1 capful in water daily    PYRIDOXINE (VITAMIN B-6) 50 MG TABLET    Take 50 mg by mouth 2 (two) times daily.     SENNA (SENOKOT) 8.6 MG TABS    Take 2 tablets (17.2 mg total) by mouth daily as needed (constipation).   SULFAMETHOXAZOLE-TRIMETHOPRIM (BACTRIM DS) 800-160 MG PER TABLET    Take 2 tablets by mouth 2 (two) times daily.   TRAMADOL (ULTRAM) 50 MG TABLET    Take 1 tablet (50 mg total) by mouth every 6 (six) hours as needed for pain.  Modified Medications   No medications on file  Discontinued Medications   No medications on file     Physical Exam: Physical Exam  Constitutional: He is oriented to person, place, and time. He appears well-developed and well-nourished. No distress.  HENT:  Head: Normocephalic and atraumatic.  Eyes: Conjunctivae and EOM are normal. Pupils are equal, round, and reactive to light.  Neck: Normal range of motion. Neck supple. No JVD present. No thyromegaly present.  Cardiovascular: Normal rate and regular rhythm.   No murmur heard. Pulmonary/Chest: Effort normal and breath sounds normal. He has no wheezes. He has no rales.  Abdominal: Soft. Bowel sounds are normal. There is no tenderness.  Genitourinary: No penile tenderness.  Musculoskeletal: Normal range of motion. He exhibits edema (LLE).  Lymphadenopathy:    He has no cervical adenopathy.  Neurological: He is alert and oriented to person, place, and time. He displays normal reflexes. No cranial nerve deficit. He exhibits normal muscle tone. Coordination normal.  Skin: No rash noted. He is not diaphoretic. There is erythema (left lateral lower leg about a palm size area of mild erythema, sweeling , warmth chronic).  Psychiatric: His speech is normal. He is slowed. He is not agitated, not aggressive, not hyperactive, not withdrawn, not actively hallucinating and not combative. Thought content is delusional. Thought content is not paranoid. Cognition and memory are not impaired. He  does not express impulsivity or inappropriate judgment. He exhibits a depressed mood (sad facial looks and depends on assistance for personal care). He exhibits normal recent memory and normal remote memory.     Filed Vitals:   06/17/12 1735  BP: 104/56  Pulse: 72  Temp: 97.8 F (36.6 C)  TempSrc: Tympanic  Resp: 18      Labs reviewed: Basic Metabolic Panel:  Recent Labs  08/65/78 01/20/12 0504 01/22/12 0445 05/12/12 06/15/12  NA 137 141 134* 138 140  K 4.7 4.2 4.2 4.4 4.5  CL 101 105 101  --   --   CO2 30 29 27   --   --  GLUCOSE 113* 90 90  --   --   BUN 29* 30* 23 23* 37*  CREATININE 1.26 1.06 1.09 0.9 1.0  CALCIUM 8.6 8.9 8.5  --   --   TSH  --  0.998  --  1.34  --     Liver Function Tests:  Recent Labs  01/19/12 1800 01/22/12 0445 05/12/12 06/15/12  AST 26 22 18 19   ALT 25 17 16 18   ALKPHOS 62 56 69 70  BILITOT 0.8 0.7  --   --   PROT 6.5 5.6*  --   --   ALBUMIN 3.1* 2.4*  --   --     CBC:  Recent Labs  01/19/12 2359 01/20/12 01/20/12 0504 05/12/12 06/15/12  WBC 6.9 6.4 6.7 6.2 8.9  HGB 11.1* 11.0* 11.2* 11.8* 13.5  HCT 32.2* 32.6* 33.6* 35* 39*  MCV 94.7 95.3 94.6  --   --   PLT 144* 146* 149* 204 217    Anemia Panel: No results found for this basename: IRON, FOLATE, VITAMINB12,  in the last 8760 hours  Significant Diagnostic Results:     Assessment/Plan Urinary tract infection, site not specified Urine culture 06/16/12 P. Mirabilis>100,000c/ml-Septra DS total 10 days started 06/16/12.   Hematuria Blood urine this am, c/o burning sensation with urination, denied suprapubic or CVA tenderness--no further occurrence since ABT started, his dysuria also subsided.     Unspecified hypothyroidism Takes Levothyroxine , last TSH 1.34 05/12/12          Lower extremity edema LLE--not on diuretic--stopped while in hospital early this year. Weight #119Ibs 05/16/12, 05/14/12, 05/13/12, 05/12/12, 05/11/12, 05/05/12. BNP 78.2 05/09/12. Will continue  daily weight--prn Furosemide 20mg  daily for wt >#2-3 Ibs/24hrs and 3-5Ibs/week.           Coronary artery disease Takes Metoprolol 12.5mg , Lipitor 20mg , ASA    CONSTIPATION Stable on MiraLax and Colace bid and Senna S II prn daily.               Family/ staff Communication: observe the patient.    Goals of care: SNF   Labs/tests ordered none

## 2012-06-17 NOTE — Assessment & Plan Note (Signed)
Takes Metoprolol 12.5mg, Lipitor 20mg, ASA 

## 2012-06-17 NOTE — Assessment & Plan Note (Signed)
Urine culture 06/16/12 P. Mirabilis>100,000c/ml-Septra DS total 10 days started 06/16/12.

## 2012-06-17 NOTE — Assessment & Plan Note (Signed)
Blood urine this am, c/o burning sensation with urination, denied suprapubic or CVA tenderness--no further occurrence since ABT started, his dysuria also subsided.

## 2012-07-02 ENCOUNTER — Encounter (HOSPITAL_COMMUNITY): Payer: Self-pay | Admitting: Nurse Practitioner

## 2012-07-02 ENCOUNTER — Emergency Department (HOSPITAL_COMMUNITY)
Admission: EM | Admit: 2012-07-02 | Discharge: 2012-07-02 | Disposition: A | Discharge status: Skilled Nursing Facility | Payer: Medicare Other | Attending: Emergency Medicine | Admitting: Emergency Medicine

## 2012-07-02 DIAGNOSIS — Z8739 Personal history of other diseases of the musculoskeletal system and connective tissue: Secondary | ICD-10-CM | POA: Insufficient documentation

## 2012-07-02 DIAGNOSIS — R300 Dysuria: Secondary | ICD-10-CM | POA: Insufficient documentation

## 2012-07-02 DIAGNOSIS — Z87828 Personal history of other (healed) physical injury and trauma: Secondary | ICD-10-CM | POA: Insufficient documentation

## 2012-07-02 DIAGNOSIS — Z79899 Other long term (current) drug therapy: Secondary | ICD-10-CM | POA: Insufficient documentation

## 2012-07-02 DIAGNOSIS — M1990 Unspecified osteoarthritis, unspecified site: Secondary | ICD-10-CM | POA: Insufficient documentation

## 2012-07-02 DIAGNOSIS — Z8669 Personal history of other diseases of the nervous system and sense organs: Secondary | ICD-10-CM | POA: Insufficient documentation

## 2012-07-02 DIAGNOSIS — F0390 Unspecified dementia without behavioral disturbance: Secondary | ICD-10-CM | POA: Insufficient documentation

## 2012-07-02 DIAGNOSIS — N390 Urinary tract infection, site not specified: Secondary | ICD-10-CM | POA: Insufficient documentation

## 2012-07-02 DIAGNOSIS — K5900 Constipation, unspecified: Secondary | ICD-10-CM | POA: Insufficient documentation

## 2012-07-02 DIAGNOSIS — Z8659 Personal history of other mental and behavioral disorders: Secondary | ICD-10-CM | POA: Insufficient documentation

## 2012-07-02 DIAGNOSIS — Z77090 Contact with and (suspected) exposure to asbestos: Secondary | ICD-10-CM | POA: Insufficient documentation

## 2012-07-02 DIAGNOSIS — Z862 Personal history of diseases of the blood and blood-forming organs and certain disorders involving the immune mechanism: Secondary | ICD-10-CM | POA: Insufficient documentation

## 2012-07-02 DIAGNOSIS — R319 Hematuria, unspecified: Secondary | ICD-10-CM | POA: Insufficient documentation

## 2012-07-02 DIAGNOSIS — Z8639 Personal history of other endocrine, nutritional and metabolic disease: Secondary | ICD-10-CM | POA: Insufficient documentation

## 2012-07-02 DIAGNOSIS — Z872 Personal history of diseases of the skin and subcutaneous tissue: Secondary | ICD-10-CM | POA: Insufficient documentation

## 2012-07-02 DIAGNOSIS — Z7982 Long term (current) use of aspirin: Secondary | ICD-10-CM | POA: Insufficient documentation

## 2012-07-02 DIAGNOSIS — I2510 Atherosclerotic heart disease of native coronary artery without angina pectoris: Secondary | ICD-10-CM | POA: Insufficient documentation

## 2012-07-02 DIAGNOSIS — Z9889 Other specified postprocedural states: Secondary | ICD-10-CM | POA: Insufficient documentation

## 2012-07-02 DIAGNOSIS — Z8679 Personal history of other diseases of the circulatory system: Secondary | ICD-10-CM | POA: Insufficient documentation

## 2012-07-02 DIAGNOSIS — Z8719 Personal history of other diseases of the digestive system: Secondary | ICD-10-CM | POA: Insufficient documentation

## 2012-07-02 LAB — CBC WITH DIFFERENTIAL/PLATELET
Basophils Absolute: 0 10*3/uL (ref 0.0–0.1)
Eosinophils Relative: 4 % (ref 0–5)
HCT: 37.2 % — ABNORMAL LOW (ref 39.0–52.0)
Lymphocytes Relative: 21 % (ref 12–46)
Lymphs Abs: 1.8 10*3/uL (ref 0.7–4.0)
MCV: 88.2 fL (ref 78.0–100.0)
Monocytes Absolute: 1.1 10*3/uL — ABNORMAL HIGH (ref 0.1–1.0)
Monocytes Relative: 14 % — ABNORMAL HIGH (ref 3–12)
RDW: 14.5 % (ref 11.5–15.5)
WBC: 8.2 10*3/uL (ref 4.0–10.5)

## 2012-07-02 LAB — POCT I-STAT, CHEM 8
BUN: 37 mg/dL — ABNORMAL HIGH (ref 6–23)
Calcium, Ion: 1.05 mmol/L — ABNORMAL LOW (ref 1.13–1.30)
Chloride: 104 mEq/L (ref 96–112)
Creatinine, Ser: 1.3 mg/dL (ref 0.50–1.35)
TCO2: 15 mmol/L (ref 0–100)

## 2012-07-02 LAB — URINALYSIS, ROUTINE W REFLEX MICROSCOPIC
Bilirubin Urine: NEGATIVE
Glucose, UA: NEGATIVE mg/dL
Ketones, ur: NEGATIVE mg/dL
pH: 8 (ref 5.0–8.0)

## 2012-07-02 LAB — URINE MICROSCOPIC-ADD ON

## 2012-07-02 LAB — CBC AND DIFFERENTIAL: WBC: 9.8 10^3/mL

## 2012-07-02 MED ORDER — CEPHALEXIN 500 MG PO CAPS
500.0000 mg | ORAL_CAPSULE | Freq: Once | ORAL | Status: AC
  Administered 2012-07-02: 500 mg via ORAL
  Filled 2012-07-02 (×2): qty 1

## 2012-07-02 MED ORDER — CEPHALEXIN 500 MG PO CAPS: 500.0000 mg | ORAL_CAPSULE | Freq: Four times a day (QID) | ORAL | Status: DC

## 2012-07-02 NOTE — ED Notes (Signed)
[id number]<YQ> Expected date:<BR> Expected time:<BR> Means of arrival:<BR> Comments:<BR> EMS- bleeding from penis

## 2012-07-02 NOTE — ED Notes (Signed)
Per EMS:  Pt is from [facility].  Pt had penile bleeding two weeks ago; given antibiotic therapy.  Recently finished therapy and bleeding began again today; heavy bleeding per nurse at [facility] and no clots reported.  Pt denies pain.  Pt is a no code.

## 2012-07-02 NOTE — ED Notes (Signed)
[facility] called  

## 2012-07-02 NOTE — ED Notes (Signed)
Pt's depends changed- no blood whatsoever.
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
## 2012-07-04 LAB — URINE CULTURE: Colony Count: >=100000

## 2012-07-05 ENCOUNTER — Non-Acute Institutional Stay (SKILLED_NURSING_FACILITY): Payer: Medicare Other | Admitting: Nurse Practitioner

## 2012-07-05 DIAGNOSIS — R5381 Other malaise: Secondary | ICD-10-CM

## 2012-07-05 DIAGNOSIS — R600 Localized edema: Secondary | ICD-10-CM

## 2012-07-05 DIAGNOSIS — R5383 Other fatigue: Secondary | ICD-10-CM

## 2012-07-05 DIAGNOSIS — Z951 Presence of aortocoronary bypass graft: Secondary | ICD-10-CM

## 2012-07-05 DIAGNOSIS — E039 Hypothyroidism, unspecified: Secondary | ICD-10-CM

## 2012-07-05 DIAGNOSIS — R609 Edema, unspecified: Secondary | ICD-10-CM

## 2012-07-05 DIAGNOSIS — M1990 Unspecified osteoarthritis, unspecified site: Secondary | ICD-10-CM

## 2012-07-07 NOTE — ED Notes (Signed)
Post ED Visit - Positive Culture Follow-up  Culture report reviewed by antimicrobial stewardship pharmacist: []  Wes Dulaney, Pharm.D., BCPS [x]  Celedonio Miyamoto, Pharm.D., BCPS []  Georgina Pillion, Pharm.D., BCPS []  [place], [place].D., BCPS, AAHIVP []  Estella Husk, Pharm.D., BCPS, AAHIVP  Positive urine culture Treated with Cephalexin, organism sensitive to the same and no further patient follow-up is required at this time.  Larena Sox 07/07/2012, 5:10 PM

## 2012-07-19 ENCOUNTER — Encounter: Payer: Self-pay | Admitting: Nurse Practitioner

## 2012-07-19 NOTE — Assessment & Plan Note (Signed)
Generalized weakness--no focal neurological deficit noted. W/c dependent for mobility.

## 2012-07-19 NOTE — Assessment & Plan Note (Signed)
Takes Metoprolol 12.5mg , Lipitor 20mg , ASA-dc Metoprolol--due to low bp measurement--monitor Bp/p daily.

## 2012-07-19 NOTE — Assessment & Plan Note (Signed)
Takes Levothyroxine 25mcg, last TSH 1.34 05/12/12                 
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
## 2012-07-19 NOTE — Assessment & Plan Note (Signed)
LLE--not on diuretic--stopped while in hospital early this year. Weight #119Ibs 05/16/12, 05/14/12, 05/13/12, 05/12/12, 05/11/12, 05/05/12. BNP 78.2 05/09/12. frequent weight--prn Furosemide 20mg  daily for wt >#2-3 Ibs/24hrs and 3-5Ibs/week.

## 2012-07-19 NOTE — Assessment & Plan Note (Addendum)
Hx of neck pain, back pain, left leg pain, now right ankle/foot pain: no apparent erythema, mild edema,  X-ray 6/271/4 no fracture or dislocation. Celebrex 200mg  daily since 07/05/12. Check uric acid and ESR. Had Keflex 500mg  qid for 4 days since 07/02/12 for clinically presumed cellulitis.

## 2012-07-22 ENCOUNTER — Ambulatory Visit (INDEPENDENT_AMBULATORY_CARE_PROVIDER_SITE_OTHER): Payer: Medicare Other | Admitting: Neurology

## 2012-07-22 ENCOUNTER — Telehealth: Payer: Self-pay

## 2012-07-22 ENCOUNTER — Encounter: Payer: Self-pay | Admitting: Neurology

## 2012-07-22 VITALS — BP 102/53 | HR 64 | Temp 97.9°F

## 2012-07-22 DIAGNOSIS — G20C Parkinsonism, unspecified: Secondary | ICD-10-CM

## 2012-07-22 DIAGNOSIS — R413 Other amnesia: Secondary | ICD-10-CM

## 2012-07-22 DIAGNOSIS — G20 Parkinson's disease: Secondary | ICD-10-CM
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
## 2012-07-22 NOTE — Patient Instructions (Signed)
I do want to suggest a few things today:  Remember to drink plenty of fluid, eat healthy meals and do not skip any meals. Try to eat protein with a every meal and eat a healthy snack such as fruit or nuts in between meals. Try to keep a regular sleep-wake schedule and try to exercise daily, particularly continue with physical therapy. You cannot walk on your on. Please do not stand or walk on your own.  As far as your medications are concerned, I would like to suggest no changes.   As far as diagnostic testing: no test needed at this time.   I would like to see you back in 4 months, sooner if we need to. Please call us with any interim questions, concerns, problems, updates or refill requests.

## 2012-07-28 ENCOUNTER — Telehealth: Payer: Self-pay | Admitting: Pulmonary Disease

## 2012-07-28 NOTE — Telephone Encounter (Signed)
Per SN---   Al these are valid concerns.  Ok to stop the lipitor. Have her call the nursing home and have them call and speak with Dr. Chilton Si to stop the lipitor. Called and spoke with peggy and she is aware and nothing further is needed.

## 2012-07-28 NOTE — Telephone Encounter (Signed)
Called and spoke with peggy and she stated that when the pt was last seen he was admitted to the hospital.  He has been in assisted living and now he is back in skilled nursing. Peggy stated that the pt is not able to stand at all now.  She stated his legs are stiff and not able to move them at all. Peggy stated that she would like to see the pt off of the lipitor.  She stated that she does not want to see him have a stroke or heart attack, but feels that this dose of lipitor is not needed now and thinks that if he comes off of this medication his legs may get a little better.  Peggy stated that his parkinisms (from his new neurologist Dr. Donzetta Kohut, [facility]?  Peggy would like recs from SN on this.  Please advise. Thanks  No Known Allergies

## 2012-07-30 ENCOUNTER — Telehealth: Payer: Self-pay | Admitting: Nurse Practitioner

## 2012-07-31 NOTE — Telephone Encounter (Signed)
Error

## 2012-08-09 ENCOUNTER — Encounter: Payer: Self-pay | Admitting: Nurse Practitioner

## 2012-08-09 ENCOUNTER — Non-Acute Institutional Stay (SKILLED_NURSING_FACILITY): Payer: Medicare Other | Admitting: Nurse Practitioner

## 2012-08-09 DIAGNOSIS — E039 Hypothyroidism, unspecified: Secondary | ICD-10-CM

## 2012-08-09 DIAGNOSIS — M1990 Unspecified osteoarthritis, unspecified site: Secondary | ICD-10-CM

## 2012-08-09 DIAGNOSIS — R600 Localized edema: Secondary | ICD-10-CM

## 2012-08-09 DIAGNOSIS — R609 Edema, unspecified: Secondary | ICD-10-CM

## 2012-08-09 DIAGNOSIS — K5900 Constipation, unspecified: Secondary | ICD-10-CM
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
## 2012-08-09 NOTE — Assessment & Plan Note (Addendum)
Stable on MiraLax and Colace bid and DulcoLax suppository bid as needed, MOM 30ml daily prn, Senna II daily prn.

## 2012-08-09 NOTE — Assessment & Plan Note (Signed)
BLE trace, mainly in the right ankle--prn on diuretic for weight up to 2-3 Ibs/24hrs or 3-5Ibs/week.

## 2012-08-09 NOTE — Assessment & Plan Note (Signed)
Takes Levothyroxine 25mcg, last TSH 1.34 05/12/12                 

## 2012-08-09 NOTE — Assessment & Plan Note (Signed)
Hx of neck pain, back pain, left leg pain, now right ankle/foot pain: no apparent erythema, mild edema,  X-ray 6/271/4 no fracture or dislocation. Improved Celebrex 200mg  daily since 07/05/12. Unremarkable uric acid and ESR.

## 2012-08-10 NOTE — Telephone Encounter (Signed)
Family wanted [place] to know that patient was at care facility and requested that MD provide orders for PT. I referred.

## 2012-08-18 ENCOUNTER — Encounter: Payer: Self-pay | Admitting: Internal Medicine

## 2012-08-19 ENCOUNTER — Encounter: Payer: Self-pay | Admitting: Nurse Practitioner

## 2012-08-19 ENCOUNTER — Non-Acute Institutional Stay (SKILLED_NURSING_FACILITY): Payer: Medicare Other | Admitting: Nurse Practitioner

## 2012-08-19 DIAGNOSIS — R600 Localized edema: Secondary | ICD-10-CM

## 2012-08-19 DIAGNOSIS — I959 Hypotension, unspecified: Secondary | ICD-10-CM | POA: Insufficient documentation

## 2012-08-19 DIAGNOSIS — K5900 Constipation, unspecified: Secondary | ICD-10-CM

## 2012-08-19 DIAGNOSIS — M1990 Unspecified osteoarthritis, unspecified site: Secondary | ICD-10-CM

## 2012-08-19 DIAGNOSIS — J309 Allergic rhinitis, unspecified: Secondary | ICD-10-CM

## 2012-08-19 DIAGNOSIS — E039 Hypothyroidism, unspecified: Secondary | ICD-10-CM

## 2012-08-19 DIAGNOSIS — R269 Unspecified abnormalities of gait and mobility: Secondary | ICD-10-CM

## 2012-08-19 DIAGNOSIS — R609 Edema, unspecified: Secondary | ICD-10-CM

## 2012-08-19 NOTE — Assessment & Plan Note (Signed)
Takes Levothyroxine 25mcg, last TSH 1.34 05/12/12                 

## 2012-08-19 NOTE — Assessment & Plan Note (Signed)
Off antihypertensive medications didn't help. BP 90/60 average. The patient is asymptomatic-continue to observe.

## 2012-08-19 NOTE — Assessment & Plan Note (Signed)
Stable on Mucinex bid and Clarinex 5mg  daily.

## 2012-08-19 NOTE — Assessment & Plan Note (Signed)
Stable on MiraLax daily and Colace bid, prn daily Dulcolax suppository, prn daily MOM, and prn Senna S II prn daily.

## 2012-08-19 NOTE — Assessment & Plan Note (Signed)
Generalized weakness--no focal neurological deficit noted. W/c dependent for mobility.  
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
## 2012-08-19 NOTE — Assessment & Plan Note (Signed)
BLE trace, mainly in the right ankle--prn on diuretic for weight up to 3-5Ibs/week. The patient is gaining weight gradually 07/11/12 125, 07/18/12 127, 07/24/12 124, 08/18/12 129--the patient eats 100% of his meals and apparently he is stronger. Continue to monitor weights and edema.            

## 2012-08-19 NOTE — Assessment & Plan Note (Signed)
Hx of neck pain, back pain, left leg pain, now right ankle/foot pain: no apparent erythema, mild edema,  X-ray 6/271/4 no fracture or dislocation. Much Improved Celebrex 200mg  daily since 07/05/12-the patient is up in w/c to propel himself on unit. Unremarkable uric acid and ESR.

## 2012-09-20 ENCOUNTER — Non-Acute Institutional Stay (SKILLED_NURSING_FACILITY): Payer: Medicare Other | Admitting: Nurse Practitioner

## 2012-09-20 ENCOUNTER — Encounter: Payer: Self-pay | Admitting: Nurse Practitioner

## 2012-09-20 DIAGNOSIS — J189 Pneumonia, unspecified organism: Secondary | ICD-10-CM | POA: Insufficient documentation

## 2012-09-20 DIAGNOSIS — E039 Hypothyroidism, unspecified: Secondary | ICD-10-CM

## 2012-09-20 DIAGNOSIS — I959 Hypotension, unspecified: Secondary | ICD-10-CM

## 2012-09-20 DIAGNOSIS — M1990 Unspecified osteoarthritis, unspecified site: Secondary | ICD-10-CM

## 2012-09-20 DIAGNOSIS — K5900 Constipation, unspecified: Secondary | ICD-10-CM

## 2012-09-20 NOTE — Assessment & Plan Note (Signed)
Presenting symptoms: a chronic persistent nonproductive cough. CXR 9/14/14mild airspace opacity at right base consistent with pneumonitis and atelectatic change. 7 day course of  Levaquin 750mg  daily started 09/18/12--the patient stated he feels better today.
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
## 2012-09-20 NOTE — Assessment & Plan Note (Signed)
Hx of neck pain, back pain, left leg pain, right ankle/foot pain: no apparent erythema, mild edema,  X-ray 6/271/4 no fracture or dislocation. Much Improved Celebrex 200mg  daily(therapeutic exchanged to Mobic 15mg ) since 07/05/12-the patient is up in w/c to propel himself on unit. Unremarkable uric acid and ESR.

## 2012-09-20 NOTE — Assessment & Plan Note (Signed)
Takes Levothyroxine 25mcg, last TSH 1.34 05/12/12                 

## 2012-09-20 NOTE — Assessment & Plan Note (Signed)
Asymptomatic with Sbp in 90s and Dbp 50s

## 2012-09-20 NOTE — Assessment & Plan Note (Signed)
Stable on MiraLax daily and Colace bid and Senna S II prn daily.                

## 2012-09-30 ENCOUNTER — Encounter: Payer: Self-pay | Admitting: Nurse Practitioner

## 2012-09-30 ENCOUNTER — Non-Acute Institutional Stay (SKILLED_NURSING_FACILITY): Payer: Medicare Other | Admitting: Nurse Practitioner

## 2012-09-30 DIAGNOSIS — R600 Localized edema: Secondary | ICD-10-CM

## 2012-09-30 DIAGNOSIS — W19XXXA Unspecified fall, initial encounter: Secondary | ICD-10-CM | POA: Insufficient documentation

## 2012-09-30 DIAGNOSIS — R609 Edema, unspecified: Secondary | ICD-10-CM

## 2012-09-30 DIAGNOSIS — E039 Hypothyroidism, unspecified: Secondary | ICD-10-CM

## 2012-09-30 DIAGNOSIS — F0390 Unspecified dementia without behavioral disturbance: Secondary | ICD-10-CM

## 2012-09-30 DIAGNOSIS — M1990 Unspecified osteoarthritis, unspecified site: Secondary | ICD-10-CM

## 2012-09-30 DIAGNOSIS — K219 Gastro-esophageal reflux disease without esophagitis: Secondary | ICD-10-CM

## 2012-09-30 DIAGNOSIS — K5900 Constipation, unspecified: Secondary | ICD-10-CM

## 2012-09-30 NOTE — Assessment & Plan Note (Signed)
Hx of neck pain, back pain, left leg pain, right ankle/foot pain: no apparent erythema, mild edema,  X-ray 6/271/4 no fracture or dislocation. Much Improved Celebrex 200mg daily(therapeutic exchanged to Mobic 15mg) since 07/05/12-the patient is up in w/c to propel himself on unit. Unremarkable uric acid and ESR.        
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
## 2012-09-30 NOTE — Assessment & Plan Note (Signed)
Takes Levothyroxine , last TSH 1.34 05/12/12

## 2012-09-30 NOTE — Assessment & Plan Note (Signed)
BLE trace, mainly in the right ankle--prn on diuretic for weight up to 3-5Ibs/week. The patient is gaining weight gradually 07/11/12 125, 07/18/12 127, 07/24/12 124, 08/18/12 129--the patient eats 100% of his meals and apparently he is stronger. Continue to monitor weights and edema.

## 2012-09-30 NOTE — Assessment & Plan Note (Signed)
Slow mentation

## 2012-09-30 NOTE — Assessment & Plan Note (Signed)
The patient was found on floor in his room next to his recliner with the recliner raised. The patient stated he fell when he tried to get out of bed. He is able to move his legs and arms on his own. PROM is very limited due to his generalized spasticity. Denied any new pain or limitation of ROM. Will continue to observe the patient.

## 2012-09-30 NOTE — Assessment & Plan Note (Signed)
Stable on MiraLax daily and Colace bid and Senna S II prn daily.

## 2012-09-30 NOTE — Assessment & Plan Note (Signed)
  04/20/12 fluoroscope esophagus  IMPRESSION:  1. Persistent area of smooth focal narrowing in the mid distal  esophagus. This is likely a benign stricture. Endoscopic  evaluation may be helpful for further evaluation and treatment.  2. Mild narrowing just above the esophageal vestibule.  3. Small hiatal hernia.  4. Esophageal dysmotility. F/u GI--no further choking episodes, he tolerated modified diet so far.

## 2012-10-18 ENCOUNTER — Non-Acute Institutional Stay (SKILLED_NURSING_FACILITY): Payer: Medicare Other | Admitting: Nurse Practitioner

## 2012-10-18 ENCOUNTER — Encounter: Payer: Self-pay | Admitting: Nurse Practitioner

## 2012-10-18 DIAGNOSIS — E039 Hypothyroidism, unspecified: Secondary | ICD-10-CM

## 2012-10-18 DIAGNOSIS — R1314 Dysphagia, pharyngoesophageal phase: Secondary | ICD-10-CM

## 2012-10-18 DIAGNOSIS — K219 Gastro-esophageal reflux disease without esophagitis: Secondary | ICD-10-CM

## 2012-10-18 DIAGNOSIS — F0390 Unspecified dementia without behavioral disturbance: Secondary | ICD-10-CM

## 2012-10-18 DIAGNOSIS — R05 Cough: Secondary | ICD-10-CM | POA: Insufficient documentation

## 2012-10-18 DIAGNOSIS — R600 Localized edema: Secondary | ICD-10-CM

## 2012-10-18 DIAGNOSIS — R609 Edema, unspecified: Secondary | ICD-10-CM

## 2012-10-18 DIAGNOSIS — M1990 Unspecified osteoarthritis, unspecified site: Secondary | ICD-10-CM

## 2012-10-18 NOTE — Assessment & Plan Note (Signed)
Slow mentation
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
## 2012-10-18 NOTE — Assessment & Plan Note (Signed)
Hx of neck pain, back pain, left leg pain, right ankle/foot pain: no apparent erythema, mild edema,  X-ray 6/271/4 no fracture or dislocation. Much Improved Celebrex 200mg  daily(therapeutic exchanged to Mobic 15mg ) since 07/05/12-the patient is up in w/c to propel himself on unit. Unremarkable uric acid and ESR.

## 2012-10-18 NOTE — Assessment & Plan Note (Signed)
04/20/12 fluoroscope esophagus  IMPRESSION:  1. Persistent area of smooth focal narrowing in the mid distal  esophagus. This is likely a benign stricture. Endoscopic  evaluation may be helpful for further evaluation and treatment.  2. Mild narrowing just above the esophageal vestibule.  3. Small hiatal hernia.  4. Esophageal dysmotility. F/u GI--no further choking episodes, he tolerated modified diet so far.  C/o cough associated with swallowing-will have ST to eval and tx and diet modification.   ST to evaluate and recommend diet modification since the patient requested for thin liquid-he should be able to make an informed decision regarding her diet.  Dc All Vitamins and dietary supplement per POA's request due to dysphagia.

## 2012-10-18 NOTE — Assessment & Plan Note (Signed)
Takes Levothyroxine 25mcg, last TSH 1.34 05/12/12                 

## 2012-10-18 NOTE — Assessment & Plan Note (Signed)
Chronic, associated with swallowing, will have ST to eval and tx. CXR 10/17/12 showed patchy atelectasis or interstitial pneumonitis at the right lung base improved with only minimal residual now seen

## 2012-10-18 NOTE — Assessment & Plan Note (Signed)
BLE trace, mainly in the right ankle--prn on diuretic for weight up to 3-5Ibs/week. The patient is gaining weight gradually 07/11/12 125, 07/18/12 127, 07/24/12 124, 08/18/12 129--the patient eats 100% of his meals and apparently he is stronger. Continue to monitor weights and edema.

## 2012-11-04 ENCOUNTER — Encounter: Payer: Self-pay | Admitting: Nurse Practitioner

## 2012-11-04 ENCOUNTER — Non-Acute Institutional Stay (SKILLED_NURSING_FACILITY): Payer: Medicare Other | Admitting: Nurse Practitioner

## 2012-11-04 DIAGNOSIS — R609 Edema, unspecified: Secondary | ICD-10-CM

## 2012-11-04 DIAGNOSIS — F0390 Unspecified dementia without behavioral disturbance: Secondary | ICD-10-CM

## 2012-11-04 DIAGNOSIS — R600 Localized edema: Secondary | ICD-10-CM

## 2012-11-04 DIAGNOSIS — R269 Unspecified abnormalities of gait and mobility: Secondary | ICD-10-CM

## 2012-11-04 DIAGNOSIS — B3749 Other urogenital candidiasis: Secondary | ICD-10-CM | POA: Insufficient documentation

## 2012-11-04 DIAGNOSIS — M1990 Unspecified osteoarthritis, unspecified site: Secondary | ICD-10-CM

## 2012-11-04 DIAGNOSIS — Z87898 Personal history of other specified conditions: Secondary | ICD-10-CM

## 2012-11-04 DIAGNOSIS — E039 Hypothyroidism, unspecified: Secondary | ICD-10-CM

## 2012-11-04 NOTE — Assessment & Plan Note (Signed)
Hx of neck pain, back pain, left leg pain, right ankle/foot pain: no apparent erythema, mild edema,  X-ray 6/271/4 no fracture or dislocation. Much Improved Celebrex 200mg daily(therapeutic exchanged to Mobic 15mg) since 07/05/12-the patient is up in w/c to propel himself on unit. Unremarkable uric acid and ESR.        

## 2012-11-04 NOTE — Assessment & Plan Note (Signed)
Minimal

## 2012-11-04 NOTE — Assessment & Plan Note (Signed)
Takes Levothyroxine , last TSH 1.34 05/12/12

## 2012-11-04 NOTE — Assessment & Plan Note (Signed)
W/c dependent and mechanical lift for transfer.  

## 2012-11-04 NOTE — Assessment & Plan Note (Signed)
Stable and no urinary retention.

## 2012-11-04 NOTE — Assessment & Plan Note (Signed)
Urine culture 11/04/12 showed 30,000c/ml yeast--will start Diflucan 100mg  po daily for 4 days in setting of candidiasis skin of the R+L buttocks.
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
## 2012-11-04 NOTE — Assessment & Plan Note (Signed)
Slow mentation     

## 2012-11-22 ENCOUNTER — Non-Acute Institutional Stay (SKILLED_NURSING_FACILITY): Payer: Medicare Other | Admitting: Nurse Practitioner

## 2012-11-22 DIAGNOSIS — K219 Gastro-esophageal reflux disease without esophagitis: Secondary | ICD-10-CM

## 2012-11-22 DIAGNOSIS — K5900 Constipation, unspecified: Secondary | ICD-10-CM

## 2012-11-22 DIAGNOSIS — E039 Hypothyroidism, unspecified: Secondary | ICD-10-CM

## 2012-11-22 DIAGNOSIS — R634 Abnormal weight loss: Secondary | ICD-10-CM

## 2012-11-22 DIAGNOSIS — I2510 Atherosclerotic heart disease of native coronary artery without angina pectoris: Secondary | ICD-10-CM

## 2012-11-22 DIAGNOSIS — M1990 Unspecified osteoarthritis, unspecified site: Secondary | ICD-10-CM

## 2012-11-22 DIAGNOSIS — F0390 Unspecified dementia without behavioral disturbance: Secondary | ICD-10-CM
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
## 2012-11-23 ENCOUNTER — Encounter: Payer: Self-pay | Admitting: Nurse Practitioner

## 2012-11-23 DIAGNOSIS — R634 Abnormal weight loss: Secondary | ICD-10-CM | POA: Insufficient documentation

## 2012-11-23 LAB — CBC AND DIFFERENTIAL
Hemoglobin: 8.8 g/dL — AB (ref 13.5–17.5)
Platelets: 235 10*3/uL (ref 150–399)
WBC: 13.9 10^3/mL

## 2012-11-23 LAB — BASIC METABOLIC PANEL
Creatinine: 1.4 mg/dL — AB (ref 0.6–1.3)
Glucose: 194 mg/dL
Potassium: 5.1 mmol/L (ref 3.4–5.3)

## 2012-11-23 NOTE — Assessment & Plan Note (Signed)
Takes Metoprolol 12.5mg , Lipitor 20mg , ASA-dc Metoprolol--due to low bp measurement--monitor Bp/p daily.

## 2012-11-23 NOTE — Assessment & Plan Note (Signed)
Stable on MiraLax daily and Colace bid and Senna S II prn daily.

## 2012-11-23 NOTE — Assessment & Plan Note (Addendum)
#  16Ibs--will try dietary supplement, POA declined Mirtazapine 7.5mg  nightly. Continue to monitor wt. Update CBC, CMP, TSH

## 2012-11-23 NOTE — Assessment & Plan Note (Signed)
Hx of neck pain, back pain, left leg pain, right ankle/foot pain: no apparent erythema, mild edema,  X-ray 6/271/4 no fracture or dislocation. Much Improved Celebrex 200mg daily(therapeutic exchanged to Mobic 15mg) since 07/05/12-the patient is up in w/c to propel himself on unit. Unremarkable uric acid and ESR.        

## 2012-11-23 NOTE — Assessment & Plan Note (Signed)
Takes Levothyroxine , last TSH 1.34 05/12/12, update TSH

## 2012-11-23 NOTE — Assessment & Plan Note (Signed)
Slow mentation     

## 2012-11-23 NOTE — Assessment & Plan Note (Signed)
04/20/12 fluoroscope esophagus  IMPRESSION:  1. Persistent area of smooth focal narrowing in the mid distal  esophagus. This is likely a benign stricture. Endoscopic  evaluation may be helpful for further evaluation and treatment.  2. Mild narrowing just above the esophageal vestibule.  3. Small hiatal hernia.  4. Esophageal dysmotility. F/u GI--no further choking episodes, he tolerated modified diet so far.

## 2012-11-24 ENCOUNTER — Ambulatory Visit: Payer: Medicare Other | Admitting: Neurology

## 2012-11-24 LAB — BASIC METABOLIC PANEL
BUN: 70 mg/dL — AB (ref 4–21)
Creatinine: 1.5 mg/dL — AB (ref 0.6–1.3)
Glucose: 70 mg/dL

## 2012-11-24 LAB — CBC AND DIFFERENTIAL
HCT: 26 % — AB (ref 41–53)
Platelets: 267 10*3/uL (ref 150–399)

## 2012-11-25 ENCOUNTER — Encounter (HOSPITAL_COMMUNITY): Payer: Self-pay | Admitting: Emergency Medicine

## 2012-11-25 ENCOUNTER — Inpatient Hospital Stay (HOSPITAL_COMMUNITY)
Admission: EM | Admit: 2012-11-25 | Discharge: 2012-12-05 | DRG: 811 | Disposition: E | Payer: Medicare Other | Attending: Internal Medicine | Admitting: Internal Medicine

## 2012-11-25 ENCOUNTER — Non-Acute Institutional Stay (SKILLED_NURSING_FACILITY): Payer: Medicare Other | Admitting: Nurse Practitioner

## 2012-11-25 ENCOUNTER — Encounter: Payer: Self-pay | Admitting: Nurse Practitioner

## 2012-11-25 ENCOUNTER — Emergency Department (HOSPITAL_COMMUNITY): Payer: Medicare Other

## 2012-11-25 DIAGNOSIS — R578 Other shock: Secondary | ICD-10-CM | POA: Diagnosis present

## 2012-11-25 DIAGNOSIS — D62 Acute posthemorrhagic anemia: Principal | ICD-10-CM | POA: Diagnosis present

## 2012-11-25 DIAGNOSIS — Z7982 Long term (current) use of aspirin: Secondary | ICD-10-CM

## 2012-11-25 DIAGNOSIS — R404 Transient alteration of awareness: Secondary | ICD-10-CM | POA: Diagnosis present

## 2012-11-25 DIAGNOSIS — F0390 Unspecified dementia without behavioral disturbance: Secondary | ICD-10-CM | POA: Diagnosis present

## 2012-11-25 DIAGNOSIS — R1314 Dysphagia, pharyngoesophageal phase: Secondary | ICD-10-CM

## 2012-11-25 DIAGNOSIS — E43 Unspecified severe protein-calorie malnutrition: Secondary | ICD-10-CM | POA: Diagnosis present

## 2012-11-25 DIAGNOSIS — R5381 Other malaise: Secondary | ICD-10-CM

## 2012-11-25 DIAGNOSIS — I959 Hypotension, unspecified: Secondary | ICD-10-CM

## 2012-11-25 DIAGNOSIS — Z77090 Contact with and (suspected) exposure to asbestos: Secondary | ICD-10-CM

## 2012-11-25 DIAGNOSIS — E039 Hypothyroidism, unspecified: Secondary | ICD-10-CM | POA: Diagnosis present

## 2012-11-25 DIAGNOSIS — N390 Urinary tract infection, site not specified: Secondary | ICD-10-CM

## 2012-11-25 DIAGNOSIS — K5900 Constipation, unspecified: Secondary | ICD-10-CM | POA: Diagnosis present

## 2012-11-25 DIAGNOSIS — IMO0002 Reserved for concepts with insufficient information to code with codable children: Secondary | ICD-10-CM

## 2012-11-25 DIAGNOSIS — M545 Low back pain, unspecified: Secondary | ICD-10-CM | POA: Diagnosis present

## 2012-11-25 DIAGNOSIS — R7989 Other specified abnormal findings of blood chemistry: Secondary | ICD-10-CM | POA: Diagnosis present

## 2012-11-25 DIAGNOSIS — Z5309 Procedure and treatment not carried out because of other contraindication: Secondary | ICD-10-CM

## 2012-11-25 DIAGNOSIS — M810 Age-related osteoporosis without current pathological fracture: Secondary | ICD-10-CM | POA: Diagnosis present

## 2012-11-25 DIAGNOSIS — E876 Hypokalemia: Secondary | ICD-10-CM | POA: Diagnosis present

## 2012-11-25 DIAGNOSIS — D649 Anemia, unspecified: Secondary | ICD-10-CM

## 2012-11-25 DIAGNOSIS — K922 Gastrointestinal hemorrhage, unspecified: Secondary | ICD-10-CM

## 2012-11-25 DIAGNOSIS — I779 Disorder of arteries and arterioles, unspecified: Secondary | ICD-10-CM | POA: Diagnosis present

## 2012-11-25 DIAGNOSIS — K219 Gastro-esophageal reflux disease without esophagitis: Secondary | ICD-10-CM | POA: Diagnosis present

## 2012-11-25 DIAGNOSIS — I2489 Other forms of acute ischemic heart disease: Secondary | ICD-10-CM | POA: Diagnosis present

## 2012-11-25 DIAGNOSIS — K921 Melena: Secondary | ICD-10-CM | POA: Diagnosis present

## 2012-11-25 DIAGNOSIS — I248 Other forms of acute ischemic heart disease: Secondary | ICD-10-CM | POA: Diagnosis present

## 2012-11-25 DIAGNOSIS — Z66 Do not resuscitate: Secondary | ICD-10-CM | POA: Diagnosis present

## 2012-11-25 DIAGNOSIS — I2510 Atherosclerotic heart disease of native coronary artery without angina pectoris: Secondary | ICD-10-CM | POA: Diagnosis present

## 2012-11-25 DIAGNOSIS — Z515 Encounter for palliative care: Secondary | ICD-10-CM

## 2012-11-25 DIAGNOSIS — Z951 Presence of aortocoronary bypass graft: Secondary | ICD-10-CM

## 2012-11-25 DIAGNOSIS — D72829 Elevated white blood cell count, unspecified: Secondary | ICD-10-CM | POA: Insufficient documentation

## 2012-11-25 DIAGNOSIS — D126 Benign neoplasm of colon, unspecified: Secondary | ICD-10-CM

## 2012-11-25 DIAGNOSIS — R109 Unspecified abdominal pain: Secondary | ICD-10-CM

## 2012-11-25 LAB — PREPARE RBC (CROSSMATCH)

## 2012-11-25 LAB — CBC WITH DIFFERENTIAL/PLATELET
HCT: 19.9 % — ABNORMAL LOW (ref 39.0–52.0)
Hemoglobin: 6.6 g/dL — CL (ref 13.0–17.0)
Lymphocytes Relative: 22 % (ref 12–46)
MCHC: 33.2 g/dL (ref 30.0–36.0)
Monocytes Absolute: 1.3 10*3/uL — ABNORMAL HIGH (ref 0.1–1.0)
Monocytes Relative: 16 % — ABNORMAL HIGH (ref 3–12)
Neutro Abs: 5.3 10*3/uL (ref 1.7–7.7)
WBC: 8.6 10*3/uL (ref 4.0–10.5)

## 2012-11-25 LAB — COMPREHENSIVE METABOLIC PANEL
Albumin: 2.3 g/dL — ABNORMAL LOW (ref 3.5–5.2)
BUN: 74 mg/dL — ABNORMAL HIGH (ref 6–23)
Calcium: 8.3 mg/dL — ABNORMAL LOW (ref 8.4–10.5)
Creatinine, Ser: 1.16 mg/dL (ref 0.50–1.35)
GFR calc Af Amer: 60 mL/min — ABNORMAL LOW (ref >90)
Glucose, Bld: 129 mg/dL — ABNORMAL HIGH (ref 70–99)
Total Protein: 5.4 g/dL — ABNORMAL LOW (ref 6.0–8.3)

## 2012-11-25 LAB — PROTIME-INR
INR: 1.13 (ref 0.00–1.49)
Prothrombin Time: 14.3 seconds (ref 11.6–15.2)

## 2012-11-25 LAB — BASIC METABOLIC PANEL
Creatinine: 1.1 mg/dL (ref 0.6–1.3)
Glucose: 113 mg/dL
Potassium: 4.3 mmol/L (ref 3.4–5.3)
Sodium: 139 mmol/L (ref 137–147)

## 2012-11-25 LAB — CBC AND DIFFERENTIAL: WBC: 6.9 10^3/mL

## 2012-11-25 LAB — APTT: aPTT: 28 seconds (ref 24–37)

## 2012-11-25 LAB — ABO/RH: ABO/RH(D): O POS

## 2012-11-25 MED ORDER — SENNA 8.6 MG PO TABS: 2.0000 | ORAL_TABLET | Freq: Every day | ORAL | Status: DC | PRN

## 2012-11-25 MED ORDER — BOOST PO LIQD
237.0000 mL | Freq: Three times a day (TID) | ORAL | Status: DC
  Administered 2012-11-25 – 2012-11-30 (×10): 237 mL via ORAL
  Filled 2012-11-25 (×16): qty 237

## 2012-11-25 MED ORDER — DEXTROSE 5 % IV SOLN
1.0000 g | INTRAVENOUS | Status: DC
  Administered 2012-11-25: 1 g via INTRAVENOUS
  Filled 2012-11-25: qty 10

## 2012-11-25 MED ORDER — POLYETHYLENE GLYCOL 3350 17 G PO PACK
17.0000 g | PACK | Freq: Every day | ORAL | Status: DC
  Administered 2012-11-27 – 2012-11-28 (×2): 17 g via ORAL
  Filled 2012-11-25 (×4): qty 1

## 2012-11-25 MED ORDER — PANTOPRAZOLE SODIUM 40 MG IV SOLR
40.0000 mg | Freq: Two times a day (BID) | INTRAVENOUS | Status: DC
  Administered 2012-11-25 – 2012-11-30 (×10): 40 mg via INTRAVENOUS
  Filled 2012-11-25 (×12): qty 40

## 2012-11-25 MED ORDER — ONDANSETRON HCL 4 MG/2ML IJ SOLN
4.0000 mg | Freq: Four times a day (QID) | INTRAMUSCULAR | Status: DC | PRN
  Administered 2012-11-29: 4 mg via INTRAVENOUS
  Filled 2012-11-25: qty 2

## 2012-11-25 MED ORDER — POLYETHYLENE GLYCOL 3350 17 GM/SCOOP PO POWD: 17.0000 g | Freq: Every morning | ORAL | Status: DC

## 2012-11-25 MED ORDER — DOCUSATE SODIUM 100 MG PO CAPS
100.0000 mg | ORAL_CAPSULE | Freq: Two times a day (BID) | ORAL | Status: DC
  Administered 2012-11-26 – 2012-11-28 (×6): 100 mg via ORAL
  Filled 2012-11-25 (×9): qty 1

## 2012-11-25 MED ORDER — SODIUM CHLORIDE 0.9 % IV SOLN
1000.0000 mL | INTRAVENOUS | Status: DC
  Administered 2012-11-25: 1000 mL via INTRAVENOUS

## 2012-11-25 MED ORDER — ONDANSETRON HCL 4 MG PO TABS: 4.0000 mg | ORAL_TABLET | Freq: Four times a day (QID) | ORAL | Status: DC | PRN

## 2012-11-25 MED ORDER — POLYVINYL ALCOHOL 1.4 % OP SOLN
1.0000 [drp] | Freq: Four times a day (QID) | OPHTHALMIC | Status: DC | PRN
  Filled 2012-11-25: qty 15

## 2012-11-25 MED ORDER — INFLUENZA VAC SPLIT QUAD 0.5 ML IM SUSP
0.5000 mL | INTRAMUSCULAR | Status: DC
  Filled 2012-11-25 (×2): qty 0.5

## 2012-11-25 MED ORDER — HYDROCODONE-ACETAMINOPHEN 5-325 MG PO TABS
1.0000 | ORAL_TABLET | ORAL | Status: DC | PRN
  Administered 2012-11-27 – 2012-11-28 (×3): 1 via ORAL
  Filled 2012-11-25 (×3): qty 1

## 2012-11-25 MED ORDER — ALUM & MAG HYDROXIDE-SIMETH 200-200-20 MG/5ML PO SUSP
30.0000 mL | Freq: Four times a day (QID) | ORAL | Status: DC | PRN
  Filled 2012-11-25: qty 30

## 2012-11-25 MED ORDER — ACETAMINOPHEN 650 MG RE SUPP: 650.0000 mg | Freq: Four times a day (QID) | RECTAL | Status: DC | PRN

## 2012-11-25 MED ORDER — MAGNESIUM HYDROXIDE 400 MG/5ML PO SUSP
30.0000 mL | Freq: Every day | ORAL | Status: DC | PRN
  Administered 2012-11-28: 30 mL via ORAL
  Filled 2012-11-25: qty 30

## 2012-11-25 MED ORDER — PRO-STAT SUGAR FREE PO LIQD
30.0000 mL | Freq: Two times a day (BID) | ORAL | Status: DC
  Administered 2012-11-25 – 2012-11-30 (×9): 30 mL via ORAL
  Filled 2012-11-25 (×11): qty 30

## 2012-11-25 MED ORDER — BISACODYL 10 MG RE SUPP: 10.0000 mg | Freq: Every day | RECTAL | Status: DC | PRN

## 2012-11-25 MED ORDER — LEVOTHYROXINE SODIUM 25 MCG PO TABS
25.0000 ug | ORAL_TABLET | Freq: Every day | ORAL | Status: DC
  Administered 2012-11-26 – 2012-11-30 (×5): 25 ug via ORAL
  Filled 2012-11-25 (×6): qty 1

## 2012-11-25 MED ORDER — ACETAMINOPHEN 325 MG PO TABS: 650.0000 mg | ORAL_TABLET | Freq: Four times a day (QID) | ORAL | Status: DC | PRN

## 2012-11-25 MED ORDER — SODIUM CHLORIDE 0.9 % IV SOLN
INTRAVENOUS | Status: DC
  Administered 2012-11-25 – 2012-11-26 (×2): via INTRAVENOUS

## 2012-11-25 MED ORDER — LORATADINE 10 MG PO TABS
10.0000 mg | ORAL_TABLET | Freq: Every day | ORAL | Status: DC
  Administered 2012-11-26 – 2012-11-28 (×3): 10 mg via ORAL
  Filled 2012-11-25 (×4): qty 1

## 2012-11-25 MED ORDER — POLYETHYL GLYCOL-PROPYL GLYCOL 0.4-0.3 % OP SOLN: 1.0000 [drp] | Freq: Four times a day (QID) | OPHTHALMIC | Status: DC | PRN

## 2012-11-25 NOTE — Assessment & Plan Note (Signed)
Improved on Rocephin empirical for UTI. UA 11/24/12 enterococcus >100,000c/ml, urine culture pending. Wbc 13.9 11/23/12--6.9 11/07/2012.

## 2012-11-25 NOTE — ED Notes (Signed)
Critical lab value given to MD  

## 2012-11-25 NOTE — Progress Notes (Addendum)
12/04/2012 1723 called for report placed on hold no one came back to telephone after 11/07/2012 1802 Called for report placed on hold no came to telephone after 11/08/2012 1823 Lequita Halt called to give report. Report taken.

## 2012-11-25 NOTE — ED Notes (Signed)
Davis, N at bedside attempting ultrasound iv.
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
## 2012-11-25 NOTE — Assessment & Plan Note (Signed)
Urine culture 11/24/12 enterococcus >100,000c/ml, empirical ABT Rocephin 1gm IM bid since 11/22/12.

## 2012-11-25 NOTE — ED Notes (Signed)
Pt refused second IV-MD informed

## 2012-11-25 NOTE — Assessment & Plan Note (Signed)
worse

## 2012-11-25 NOTE — Assessment & Plan Note (Signed)
Hgb dropped down to 6.9 today, Fe 325mg  bid since 11/22/12. ED to evaluate.

## 2012-11-25 NOTE — Progress Notes (Signed)
Utilization Review completed.  Cynai Skeens RN CM  

## 2012-11-25 NOTE — ED Notes (Signed)
IV team paged. x2 unsuccessful IV attempt.

## 2012-11-25 NOTE — Assessment & Plan Note (Signed)
Hypovolemia due to GI bleed and poor oral intake. ED evaluate.

## 2012-11-25 NOTE — ED Notes (Signed)
Per EMS, sent here for low hemoglobin-per nursing facility it was 8.8 a couple days ago now 6.8-asymptomatic although pale-nursing staff did notice blood in stool and has been complaining of abdominal pain

## 2012-11-25 NOTE — Assessment & Plan Note (Addendum)
Hem occult positive stools, ASA and Mobic held since 11/22/12. Prilosec 20mg  bid started 11/22/12. Fe 325mg  bid started 11/22/12. CBC and BMP 11/23/12, 11/24/12, 11/06/2012: Hgb dropped from 13.4 07/02/12--8.8 11/23/12--8.8 11/24/12--6.9 11//21/4. GI consult requested and pending. ED for evaluation now.
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
## 2012-11-25 NOTE — ED Notes (Signed)
Pt's friend Ms. Raelene Bott would like to be called when pt is moved to a room and for any updates. Pt verbalized okay to speak medical information with friend.   NUMBER: [phone]
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
## 2012-11-26 DIAGNOSIS — N390 Urinary tract infection, site not specified: Secondary | ICD-10-CM | POA: Diagnosis present

## 2012-11-26 DIAGNOSIS — K922 Gastrointestinal hemorrhage, unspecified: Secondary | ICD-10-CM

## 2012-11-26 DIAGNOSIS — D62 Acute posthemorrhagic anemia: Principal | ICD-10-CM

## 2012-11-26 LAB — BASIC METABOLIC PANEL
CO2: 22 mEq/L (ref 19–32)
Calcium: 7.8 mg/dL — ABNORMAL LOW (ref 8.4–10.5)
Chloride: 107 mEq/L (ref 96–112)
Creatinine, Ser: 0.98 mg/dL (ref 0.50–1.35)
GFR calc Af Amer: 79 mL/min — ABNORMAL LOW (ref >90)
Potassium: 4 mEq/L (ref 3.5–5.1)

## 2012-11-26 LAB — CBC
Hemoglobin: 9.3 g/dL — ABNORMAL LOW (ref 13.0–17.0)
MCH: 31.6 pg (ref 26.0–34.0)
MCHC: 35.5 g/dL (ref 30.0–36.0)
MCHC: 35.7 g/dL (ref 30.0–36.0)
MCV: 87.7 fL (ref 78.0–100.0)
Platelets: 196 10*3/uL (ref 150–400)
Platelets: 183 10*3/uL (ref 150–400)
RBC: 2.12 MIL/uL — ABNORMAL LOW (ref 4.22–5.81)
RDW: 16 % — ABNORMAL HIGH (ref 11.5–15.5)
RDW: 14.7 % (ref 11.5–15.5)
WBC: 10.6 10*3/uL — ABNORMAL HIGH (ref 4.0–10.5)

## 2012-11-26 LAB — PREPARE RBC (CROSSMATCH)

## 2012-11-26 LAB — PRO B NATRIURETIC PEPTIDE: Pro B Natriuretic peptide (BNP): 1340 pg/mL — ABNORMAL HIGH (ref 0–450)

## 2012-11-26 MED ORDER — SODIUM CHLORIDE 0.9 % IV SOLN
500.0000 mg | Freq: Three times a day (TID) | INTRAVENOUS | Status: DC
  Administered 2012-11-26 – 2012-11-29 (×8): 500 mg via INTRAVENOUS
  Filled 2012-11-26 (×10): qty 500

## 2012-11-26 MED ORDER — FUROSEMIDE 10 MG/ML IJ SOLN
20.0000 mg | Freq: Once | INTRAMUSCULAR | Status: AC
  Administered 2012-11-26: 20 mg via INTRAVENOUS
  Filled 2012-11-26: qty 2

## 2012-11-26 MED ORDER — SODIUM CHLORIDE 0.9 % IV SOLN: INTRAVENOUS | Status: DC

## 2012-11-26 NOTE — Progress Notes (Signed)
CRITICAL VALUE ALERT  Critical value received:  Troponin 0.34  Date of notification:  11/26/12  Time of notification:  2250  Critical value read back: yes  Nurse who received alert:  Doretha Sou, RN  MD notified (1st page):  Benedetto Coons  Time of first page:  2258  MD notified (2nd page):  Time of second page:  Responding MD: Benedetto Coons  Time MD responded:  2300
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
## 2012-11-26 NOTE — ED Provider Notes (Signed)
Medical screening examination/treatment/procedure(s) were conducted as a shared visit with non-physician practitioner(s) and myself.  I personally evaluated the patient during the encounter.  EKG Interpretation    Date/Time:    Ventricular Rate:    PR Interval:    QRS Duration:   QT Interval:    QTC Calculation:   R Axis:     Text Interpretation:              Pt w gi bleed, hx diverticula, hgb decreased from prior. abd soft nt. Labs. Admit.   Suzi Roots, MD 11/26/12 [phone]

## 2012-11-26 NOTE — Progress Notes (Signed)
[facility] PROGRESS NOTE   [name] YQM:[id number] DOB: 08/23/1916 DOA: Dec 22, 2012 PCP: Michele Mcalpine, MD  Brief narrative: [name] is an 79 y.o. male with a PMH of coronary artery disease for which he typically takes aspirin therapy and osteoarthritis for which he typically takes Mobic, chronic iron deficiency anemia for which he is on chronic iron therapy, who was sent to the ED from his SNF secondary to anemia and Hemoccult-positive and melanotic stools.    Assessment/Plan: Principal Problem:  Acute blood loss anemia / suspected upper GI bleed  Suspect the patient has an upper GI source of bleeding given his chronic treatment with aspirin and Mobic in the setting of melanotic stools. He also has an elevated BUN, consistent with an upper GI source. Given a unit of blood on 2012/12/22 with no improvement in hemoglobin. Mobic and aspirin as well as Lasix remains on hold. Continue PPI therapy. Gastroenterology consultation has been requested. EGD planned for 11/10/2012.   We'll keep on full liquids for now. NPO after midnight. Active Problems:  Coronary artery disease  Aspirin on hold. Complaining of chest pressure, likely from demand ischemia in the setting of anemia.  Cycle cardiac enzymes.  KVO IVF and give lasix in between units of PRBCs.  Check pro-BNP. Unspecified hypothyroidism  Continue home dose of Synthroid.  Protein calorie malnutrition  Dietitian consultation requested. Continue boost supplements and protein supplements.  UTI  D/C IV Rocephin based on outside culture results.  Start Ampicillin.   Code Status: DNR Family Communication: [name], [phone], updated by telephone. Disposition Plan: SNF, from [facility].   IV access:  Peripheral IV  Medical Consultants:  Dr. Rob Bunting, Gastroenterology  Other Consultants:  Dietitian  Anti-infectives:  Rocephin 11/23/2012--->11/26/12  Ampicillin  11/26/12--->  HPI/Subjective: [name] tells me he is having some chest pressure, but that he feels "better" than he did yesterday.  No shortness of breath.    Objective: Filed Vitals:   12/22/12 1959 12/22/12 2253 11/26/12 0033 11/26/12 0558  BP: 92/40 96/44  94/47  Pulse: 78 78  80  Temp: 97.5 F (36.4 C) 97.8 F (36.6 C)  97.6 F (36.4 C)  TempSrc: Oral Oral  Axillary  Resp: 18 20  20   Height:   5\' 2"  (1.575 m)   Weight:   53.6 kg (118 lb 2.7 oz)   SpO2: 100% 94%  97%    Intake/Output Summary (Last 24 hours) at 11/26/12 0744 Last data filed at 11/26/12 0737  Gross per 24 hour  Intake 1126.61 ml  Output    400 ml  Net 726.61 ml    Exam: Gen:  NAD Cardiovascular:  RRR, No M/R/G Respiratory:  Lungs diminished Gastrointestinal:  Abdomen soft, NT/ND, + BS Extremities:  No C/E/C  Data Reviewed: asic Metabolic Panel:  Recent Labs Lab 11/23/12 11/24/12 12-22-12 2012-12-22 1530 11/26/12 0357  NA 136* 138 139 138 137  K 5.1 4.4 4.3 4.6 4.0  CL  --   --   --  105 107  CO2  --   --   --  23 22  GLUCOSE  --   --   --  129* 110*  BUN 52* 70* 54* 74* 65*  CREATININE 1.4* 1.5* 1.1 1.16 0.98  CALCIUM  --   --   --  8.3* 7.8*   GFR Estimated Creatinine Clearance: 34.9 ml/min (by C-G formula based on Cr of 0.98). Liver Function Tests:  Recent Labs Lab 2012/12/22 1530  AST 13  ALT 8  ALKPHOS 48  BILITOT 0.1*  PROT 5.4*  ALBUMIN 2.3*   Coagulation profile  Recent Labs Lab 11/20/2012 1530  INR 1.13    CBC:  Recent Labs Lab 11/23/12 11/24/12 11/29/2012 11/05/2012 1530 11/26/12 0357  WBC 13.9 9.1 6.9 8.6 10.6*  NEUTROABS  --   --   --  5.3  --   HGB 8.8* 8.8* 6.9* 6.6* 6.6*  HCT 26* 26* 20* 19.9* 18.6*  MCV  --   --   --  90.9 87.7  PLT 235 267 219 276 196   BNP (last 3 results)  Recent Labs  01/19/12 1800  PROBNP 1245.0*   Thyroid function studies  Recent Labs  11/24/12  TSH 0.68     Procedures and Diagnostic Studies: Dg Chest 2  View  11/20/2012   CLINICAL DATA:  Blood in stool.  Anemia.  Abdominal pain.  EXAM: CHEST  2 VIEW  COMPARISON:  Chest CT 01/20/2012. Chest x-ray 01/18/2013. Chest x-ray 09/08/2010.  FINDINGS: This was a limited exam is again exam as the patient could not stand out. Exam was performed with the patient supine. Calcified pleural plaques are noted bilaterally. This could be from prior asbestos exposure. Mild atelectasis versus infiltrate right lung base cannot be excluded. No pleural effusion. No pneumothorax. Prior CABG. Pulmonary vascularity is normal. Lucency noted over the anterior abdominal wall on the supine lateral view, this is most likely bowel gas. Abdominal series could be obtained for further evaluation.  IMPRESSION: 1. Mild atelectasis versus infiltrate right lung base. 2. Prior CABG.  No CHF. 3. Calcified pleural plaques, possibly related to prior asbestos exposure.  Lucency noted over the anterior abdomen on supine lateral view. This is most likely bowel. Given patient's history, abdominal series may prove useful for further evaluation.   Electronically Signed   By: [name]  Register   On: 11/07/2012 16:25    Scheduled Meds: . cefTRIAXone (ROCEPHIN)  IV  1 g Intravenous Q24H  . docusate sodium  100 mg Oral BID  . feeding supplement (PRO-STAT SUGAR FREE 64)  30 mL Oral BID  . influenza vac split quadrivalent PF  0.5 mL Intramuscular Tomorrow-1000  . lactose free nutrition  237 mL Oral TID BM  . levothyroxine  25 mcg Oral QAC breakfast  . loratadine  10 mg Oral Daily  . pantoprazole (PROTONIX) IV  40 mg Intravenous Q12H  . polyethylene glycol  17 g Oral Daily   Continuous Infusions: . sodium chloride 75 mL/hr at 11/26/12 0719    Time spent: 35 minutes with > 50% of time discussing current diagnostic test results, clinical impression and plan of care with the patient's son.    LOS: 1 day   RAMA,CHRISTINA  [facility] Pager [phone].   *Please note that the hospitalists  switch teams on Wednesdays. Please call the flow manager at [phone] if you are having difficulty reaching the hospitalist taking care of this patient as she can update you and provide the most up-to-date pager number of provider caring for the patient. If 8PM-8AM, please contact night-coverage at [email or web address], password [facility]  11/26/2012, 7:44 AM

## 2012-11-27 ENCOUNTER — Encounter (HOSPITAL_COMMUNITY): Admission: EM | Disposition: E | Payer: Medicare Other | Source: Home / Self Care | Attending: Internal Medicine

## 2012-11-27 ENCOUNTER — Encounter (HOSPITAL_COMMUNITY): Payer: Self-pay | Admitting: *Deleted

## 2012-11-27 DIAGNOSIS — I248 Other forms of acute ischemic heart disease: Secondary | ICD-10-CM | POA: Diagnosis present

## 2012-11-27 HISTORY — PX: ESOPHAGOGASTRODUODENOSCOPY: SHX5428

## 2012-11-27 LAB — BASIC METABOLIC PANEL
CO2: 22 mEq/L (ref 19–32)
Chloride: 107 mEq/L (ref 96–112)
Creatinine, Ser: 0.9 mg/dL (ref 0.50–1.35)
GFR calc Af Amer: 82 mL/min — ABNORMAL LOW (ref >90)
Potassium: 3.4 mEq/L — ABNORMAL LOW (ref 3.5–5.1)
Sodium: 138 mEq/L (ref 135–145)

## 2012-11-27 LAB — TROPONIN I
Troponin I: 0.32 ng/mL (ref <0.30)
Troponin I: 0.3 ng/mL (ref <0.30)

## 2012-11-27 LAB — CBC
MCV: 88.1 fL (ref 78.0–100.0)
Platelets: 169 10*3/uL (ref 150–400)
RBC: 2.86 MIL/uL — ABNORMAL LOW (ref 4.22–5.81)
RDW: 14.8 % (ref 11.5–15.5)
WBC: 8.4 10*3/uL (ref 4.0–10.5)

## 2012-11-27 SURGERY — EGD (ESOPHAGOGASTRODUODENOSCOPY)
Anesthesia: Moderate Sedation

## 2012-11-27 MED ORDER — POTASSIUM CHLORIDE IN NACL 20-0.9 MEQ/L-% IV SOLN
INTRAVENOUS | Status: DC
  Administered 2012-11-27: 08:00:00 via INTRAVENOUS
  Administered 2012-11-29: 1000 mL via INTRAVENOUS
  Filled 2012-11-27 (×3): qty 1000

## 2012-11-27 MED ORDER — MIDAZOLAM HCL 10 MG/2ML IJ SOLN
INTRAMUSCULAR | Status: AC
  Filled 2012-11-27: qty 2

## 2012-11-27 MED ORDER — FENTANYL CITRATE 0.05 MG/ML IJ SOLN
INTRAMUSCULAR | Status: AC
  Filled 2012-11-27: qty 2

## 2012-11-27 NOTE — Progress Notes (Signed)
[facility] Progress Note    Since last GI note: Was planning on EGD this AM however BP in endo was 60s/30s.  Repeat checks were about the same, highest was 78 systolic.  Per floor RN; he slept well.  He has had No overt GI bleeding in at least 25 hours.  Objective: Vital signs in last 24 hours: Temp:  [97.1 F (36.2 C)-98.9 F (37.2 C)] 98 F (36.7 C) (11/23 0522) Pulse Rate:  [66-108] 66 (11/23 0522) Resp:  [16-23] 23 (11/23 0735) BP: (74-96)/(33-52) 74/33 mmHg (11/23 0735) SpO2:  [94 %-100 %] 96 % (11/23 0735) Last BM Date:  (PTA) General: alert and oriented times 3 Heart: regular rate and rythm Abdomen: soft, non-tender, non-distended, normal bowel sounds   Lab Results:  Recent Labs  11/26/12 0357 11/26/12 2055 11/29/2012 0301  WBC 10.6* 8.5 8.4  HGB 6.6* 9.3* 9.0*  PLT 196 183 169  MCV 87.7 88.5 88.1    Recent Labs  Dec 08, 2012 1530 11/26/12 0357 12/02/2012 0301  NA 138 137 138  K 4.6 4.0 3.4*  CL 105 107 107  CO2 23 22 22   GLUCOSE 129* 110* 91  BUN 74* 65* 47*  CREATININE 1.16 0.98 0.90  CALCIUM 8.3* 7.8* 7.8*    Recent Labs  12-08-12 1530  PROT 5.4*  ALBUMIN 2.3*  AST 13  ALT 8  ALKPHOS 48  BILITOT 0.1*    Recent Labs  12/08/12 1530  INR 1.13     Studies/Results: Dg Chest 2 View  12-08-2012   CLINICAL DATA:  Blood in stool.  Anemia.  Abdominal pain.  EXAM: CHEST  2 VIEW  COMPARISON:  Chest CT 01/20/2012. Chest x-ray 01/18/2013. Chest x-ray 09/08/2010.  FINDINGS: This was a limited exam is again exam as the patient could not stand out. Exam was performed with the patient supine. Calcified pleural plaques are noted bilaterally. This could be from prior asbestos exposure. Mild atelectasis versus infiltrate right lung base cannot be excluded. No pleural effusion. No pneumothorax. Prior CABG. Pulmonary vascularity is normal. Lucency noted over the anterior abdominal wall on the supine lateral view, this is most likely bowel gas.  Abdominal series could be obtained for further evaluation.  IMPRESSION: 1. Mild atelectasis versus infiltrate right lung base. 2. Prior CABG.  No CHF. 3. Calcified pleural plaques, possibly related to prior asbestos exposure.  Lucency noted over the anterior abdomen on supine lateral view. This is most likely bowel. Given patient's history, abdominal series may prove useful for further evaluation.   Electronically Signed   By: [name]  Register   On: December 08, 2012 16:25     Medications: Scheduled Meds: . ampicillin (OMNIPEN) IV  500 mg Intravenous Q8H  . docusate sodium  100 mg Oral BID  . feeding supplement (PRO-STAT SUGAR FREE 64)  30 mL Oral BID  . influenza vac split quadrivalent PF  0.5 mL Intramuscular Tomorrow-1000  . lactose free nutrition  237 mL Oral TID BM  . levothyroxine  25 mcg Oral QAC breakfast  . loratadine  10 mg Oral Daily  . pantoprazole (PROTONIX) IV  40 mg Intravenous Q12H  . polyethylene glycol  17 g Oral Daily   Continuous Infusions: . sodium chloride 20 mL/hr (11/26/12 1239)  . sodium chloride     PRN Meds:.acetaminophen, acetaminophen, alum & mag hydroxide-simeth, bisacodyl, HYDROcodone-acetaminophen, magnesium hydroxide, ondansetron (ZOFRAN) IV, ondansetron, polyvinyl alcohol, senna    Assessment/Plan: 79 y.o. male likely UGI bleed  Hb has bumped very nicely from low 6.6 to  9.0 with three units blood.  He has had no overt GI bleeding in at least 24 hours. His baseline is very frail and currently more frail than usual. BPs have been low since admit and this morning in endoscopy SBPs were in the 60s (highest 78).  I don't think he is continuing to bleed.  Given low pressures, very elderly frail state, I have cancelled the EGD indefinitely.  Should continue IV PPI twice daily for another 24 hours. Will check h. Pylori serologies and treat with appropriate antibiotics if positive.  I discussed this with his son and he agrees with this plan.  Should continue to hold ASA  (for at least 1 week, only restart if absolutely needed for cardiac concerns) and Mobic (indefinitely).  Will restart diet.    Rachael Fee, MD  11/09/2012, 7:56 AM [facility] Pager [phone]

## 2012-11-27 NOTE — OR Nursing (Signed)
Pt with low B/P 64/31, notified MD.  Case cancelled.

## 2012-11-27 NOTE — Progress Notes (Signed)
[facility] progress note. Chief complaint. Chest discomfort. History of present illness. This 79 year old gentleman admitted with suspected upper GI bleed and acute blood loss anemia has a history of coronary artery disease. Intermittently over the course of hospitalization he has been complaining of chest pressure/chest discomfort which prompted cycling of troponin noted mildly elevated thought secondary to demand ischemia in the setting of anemia. Patient again is complaining of chest pressure/chest discomfort tonight and come to see him at the bedside. A 12-lead EKG was obtained and this does not appear significantly changed from prior EKG. Patient describes pain/pressure over the low sternum radiating to the left chest. It is already been determined that patient is not a IV heparin candidate due to concurrently. Also blood pressure has been too soft to allow initiation of beta blockers. There is some diaphoresis but no apparent nausea or pain radiation. Vital signs. Temperature 98.2, pulse 78, respiration 18, blood pressure 93/38. O2 sats 94%. General appearance. Frail thin elderly male who is alert though only minimally verbal. Cardiac. Rate primarily regular with occasional irregular beats. Lungs. Breath sounds are clear but reduced in the bases. Abdomen. Soft with positive bowel sounds. Mild diffuse pain with palpation noted most of her upper quadrants. Impression/plan. Problem #1. Chest discomfort/pressure. 12-lead EKG looks unchanged from previous. Last troponin was 0.30. I will repeat troponins now and then every 6 hours for a total of 3 sets. Skin the patient is a not a good candidate for anticoagulation with heparin given probable upper GI bleed. Not a good candidate for beta blocker because of borderline hypotension. Will repeat an EKG in about 8 hours. We'll follow for troponin values. Cardiac consult per discretion of rounding physician.
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
## 2012-11-27 NOTE — Progress Notes (Signed)
[facility] PROGRESS NOTE   [name]:[id number] DOB: Dec 03, 1916 DOA: 11/23/2012 PCP: Michele Mcalpine, MD  Brief narrative: [name] is an 79 y.o. male with a PMH of coronary artery disease for which he typically takes aspirin therapy and osteoarthritis for which he typically takes Mobic, chronic iron deficiency anemia for which he is on chronic iron therapy, who was sent to the ED from his SNF secondary to anemia and Hemoccult-positive and melanotic stools. Is scheduled to undergo upper endoscopy this morning but case was canceled secondary to a blood pressure of 64/31.   Assessment/Plan: Principal Problem:  Acute blood loss anemia / suspected upper GI bleed  Suspect the patient has an upper GI source of bleeding given his chronic treatment with aspirin and Mobic in the setting of melanotic stools. He also has an elevated BUN, consistent with an upper GI source. Given a unit of blood on 11/28/2012 with no improvement in hemoglobin, and 2 additional units 12-13-2012 with an appropriate rise in his hemoglobin. Mobic and aspirin as well as Lasix remains on hold. Continue PPI therapy. Seen by Dr. Christella Hartigan on 12-13-2012 with plans to proceed with upper endoscopy today, but case canceled secondary to low blood pressure.  Plan now is for conservative therapy with empiric PPI, check H. Pylori serologies (and treat if positive), continue to hold ASA and Mobic.  Diet advanced. Active Problems:  Hypokalemia Add potassium to IV fluids. Coronary artery disease / demand ischemia  Aspirin on hold. Complained of chest pressure 11/26/2012 prompting [place] to cycle troponins. Mild elevation noted, likely from demand ischemia in the setting of anemia.  Pro-BNP elevated.  EF 60-65% on Echo done 01/20/2012. Unspecified hypothyroidism  Continue home dose of Synthroid.  Protein calorie malnutrition  Dietitian consultation requested. Continue boost supplements and protein supplements.  UTI   Continue Ampicillin.   Code Status: DNR Family Communication: [name], [phone], updated by telephone 11/26/12. Disposition Plan: SNF, from [facility].   IV access:  Peripheral IV  Medical Consultants:  Dr. Rob Bunting, Gastroenterology  Other Consultants:  Dietitian  Anti-infectives:  Rocephin 11/23/2012--->11/26/12  Ampicillin 11/26/12--->  HPI/Subjective: [name] tells me his chest pressure is gone, no new complaints.  Thirsty.  No shortness of breath.    Objective: Filed Vitals:   11/26/12 1848 11/26/12 2106 13-Dec-2012 0522 Dec 13, 2012 0735  BP: 91/52 96/49 92/39  74/33  Pulse: 76 108 66   Temp: 97.6 F (36.4 C) 98.9 F (37.2 C) 98 F (36.7 C)   TempSrc: Oral Oral Oral Oral  Resp: 20 19 16 23   Height:      Weight:      SpO2: 96% 98% 94% 96%    Intake/Output Summary (Last 24 hours) at 12/13/12 0758 Last data filed at 2012-12-13 0524  Gross per 24 hour  Intake 1470.33 ml  Output   1300 ml  Net 170.33 ml    Exam: Gen:  NAD Cardiovascular:  RRR, No M/R/G Respiratory:  Lungs diminished Gastrointestinal:  Abdomen soft, NT/ND, + BS Extremities:  No C/E/C  Data Reviewed: Basic Metabolic Panel:  Recent Labs Lab 11/24/12 11/26/2012 11/13/2012 1530 11/26/12 0357 December 13, 2012 0301  NA 138 139 138 137 138  K 4.4 4.3 4.6 4.0 3.4*  CL  --   --  105 107 107  CO2  --   --  23 22 22   GLUCOSE  --   --  129* 110* 91  BUN 70* 54* 74* 65* 47*  CREATININE 1.5* 1.1 1.16 0.98 0.90  CALCIUM  --   --  8.3* 7.8* 7.8*   GFR Estimated Creatinine Clearance: 38 ml/min (by C-G formula based on Cr of 0.9). Liver Function Tests:  Recent Labs Lab 11/24/2012 1530  AST 13  ALT 8  ALKPHOS 48  BILITOT 0.1*  PROT 5.4*  ALBUMIN 2.3*   Coagulation profile  Recent Labs Lab 11/09/2012 1530  INR 1.13    CBC:  Recent Labs Lab 11/05/2012 11/23/2012 1530 11/26/12 0357 11/26/12 2055 12-17-2012 0301  WBC 6.9 8.6 10.6* 8.5 8.4  NEUTROABS  --  5.3  --   --    --   HGB 6.9* 6.6* 6.6* 9.3* 9.0*  HCT 20* 19.9* 18.6* 26.9* 25.2*  MCV  --  90.9 87.7 88.5 88.1  PLT 219 276 196 183 169   Cardiac Enzymes:  Recent Labs Lab 11/26/12 2055 12-17-2012 0303  TROPONINI 0.34* 0.32*   CBG:  Recent Labs Lab 2012/12/17 0520  GLUCAP 94    Procedures and Diagnostic Studies: Dg Chest 2 View  11/24/2012   CLINICAL DATA:  Blood in stool.  Anemia.  Abdominal pain.  EXAM: CHEST  2 VIEW  COMPARISON:  Chest CT 01/20/2012. Chest x-ray 01/18/2013. Chest x-ray 09/08/2010.  FINDINGS: This was a limited exam is again exam as the patient could not stand out. Exam was performed with the patient supine. Calcified pleural plaques are noted bilaterally. This could be from prior asbestos exposure. Mild atelectasis versus infiltrate right lung base cannot be excluded. No pleural effusion. No pneumothorax. Prior CABG. Pulmonary vascularity is normal. Lucency noted over the anterior abdominal wall on the supine lateral view, this is most likely bowel gas. Abdominal series could be obtained for further evaluation.  IMPRESSION: 1. Mild atelectasis versus infiltrate right lung base. 2. Prior CABG.  No CHF. 3. Calcified pleural plaques, possibly related to prior asbestos exposure.  Lucency noted over the anterior abdomen on supine lateral view. This is most likely bowel. Given patient's history, abdominal series may prove useful for further evaluation.   Electronically Signed   By: [name]  Register   On: 11/22/2012 16:25    Scheduled Meds: . ampicillin (OMNIPEN) IV  500 mg Intravenous Q8H  . docusate sodium  100 mg Oral BID  . feeding supplement (PRO-STAT SUGAR FREE 64)  30 mL Oral BID  . influenza vac split quadrivalent PF  0.5 mL Intramuscular Tomorrow-1000  . lactose free nutrition  237 mL Oral TID BM  . levothyroxine  25 mcg Oral QAC breakfast  . loratadine  10 mg Oral Daily  . pantoprazole (PROTONIX) IV  40 mg Intravenous Q12H  . polyethylene glycol  17 g Oral Daily    Continuous Infusions: . sodium chloride 20 mL/hr (11/26/12 1239)  . sodium chloride      Time spent: 25 minutes.    LOS: 2 days   RAMA,CHRISTINA  [facility] Pager [phone].   *Please note that the hospitalists switch teams on Wednesdays. Please call the flow manager at [phone] if you are having difficulty reaching the hospitalist taking care of this patient as she can update you and provide the most up-to-date pager number of provider caring for the patient. If 8PM-8AM, please contact night-coverage at [email or web address], password [facility]  Dec 17, 2012, 7:58 AM

## 2012-11-28 ENCOUNTER — Encounter (HOSPITAL_COMMUNITY): Payer: Self-pay | Admitting: Gastroenterology

## 2012-11-28 DIAGNOSIS — R5381 Other malaise: Secondary | ICD-10-CM

## 2012-11-28 DIAGNOSIS — R1314 Dysphagia, pharyngoesophageal phase: Secondary | ICD-10-CM

## 2012-11-28 LAB — CBC
HCT: 20.9 % — ABNORMAL LOW (ref 39.0–52.0)
MCHC: 34.4 g/dL (ref 30.0–36.0)
MCV: 91.7 fL (ref 78.0–100.0)
RBC: 2.28 MIL/uL — ABNORMAL LOW (ref 4.22–5.81)
RDW: 15.5 % (ref 11.5–15.5)
WBC: 9.9 10*3/uL (ref 4.0–10.5)

## 2012-11-28 LAB — TROPONIN I: Troponin I: 0.32 ng/mL (ref <0.30)

## 2012-11-28 LAB — PREPARE RBC (CROSSMATCH)

## 2012-11-28 NOTE — Progress Notes (Addendum)
[facility] Progress Note  Subjective:  Resting comfortably in bed.  No sign of bleeding per his nurse.  Objective:  Vital signs in last 24 hours: Temp:  [97.8 F (36.6 C)-98.2 F (36.8 C)] 97.8 F (36.6 C) (11/24 0513) Pulse Rate:  [73-83] 73 (11/24 0513) Resp:  [16-20] 16 (11/24 0513) BP: (93-100)/(38-44) 100/41 mmHg (11/24 0513) SpO2:  [94 %-98 %] 96 % (11/24 0513) Last BM Date:  (PTA) General:   Alert, frail, in NAD; chronically ill-appearing. Heart:  Regular rate and rhythm Pulm:  CTAB. Abdomen:  Soft, non-distended.  BS present.  Non-tender. Extremities:  Without edema. Neurologic:  Alert; grossly normal neurologically.  Intake/Output from previous day: 11/23 0701 - 11/24 0700 In: 1769.5 [P.O.:520; I.V.:1099.5; IV Piggyback:150] Out: 1400 [Urine:1400]  Lab Results:  Recent Labs  11/26/12 2055 11/08/2012 0301 11/28/12 0530  WBC 8.5 8.4 9.9  HGB 9.3* 9.0* 7.2*  HCT 26.9* 25.2* 20.9*  PLT 183 169 170   BMET  Recent Labs  2012/12/08 1530 11/26/12 0357 11/25/2012 0301  NA 138 137 138  K 4.6 4.0 3.4*  CL 105 107 107  CO2 23 22 22   GLUCOSE 129* 110* 91  BUN 74* 65* 47*  CREATININE 1.16 0.98 0.90  CALCIUM 8.3* 7.8* 7.8*   LFT  Recent Labs  2012/12/08 1530  PROT 5.4*  ALBUMIN 2.3*  AST 13  ALT 8  ALKPHOS 48  BILITOT 0.1*   PT/INR  Recent Labs  12-08-12 1530  LABPROT 14.3  INR 1.13   Assessment / Plan: -Likely UGI bleed while on ASA and Mobic -ABLA:  Baseline Hgb in the 11 and 12 gram range.  He is s/p 3 units PRBC's during this admission.  Hgb is down two grams from yesterday to 7.2 grams this AM.  Per nursing staff, no sign of bleeding.  ? If his drop his from equilibration.  Going to receive another unit PRBC's this AM.  *With no overt sign of GIB, will continue to monitor for now and hold off with endoscopic evaluation. *Follow-up Hpylori and treat if positive. *Hold mobic indefinitely and only resume ASA if needed for cardiac  concerns. *Continue BID PPI.    LOS: 3 days   ZEHR, JESSICA D.  11/28/2012, 8:18 AM  Pager number [phone]    Attending physician's note   I have taken an interval history, reviewed the chart and examined the patient. I agree with the Advanced Practitioner's note, impression and recommendations. No overt signs of ongoing or recurrent GI bleeding. Hb has dropped-he may still be equilibrating. PRBC transfusion planned for today. Would target for Hb > 9. Continue treatment for a presumed gastric or duodenal ulcer with bleeding. No plans for EGD at this time. Can reconsider if he has recurrent bleeding and he appears fit to tolerate an EGD. H. pylori Ab is negative.  Venita Lick. Russella Dar, MD [facility]

## 2012-11-28 NOTE — Clinical Documentation Improvement (Signed)
Pt with protein calorie malnutrition per H/P  Clarification Needed  Pt with Severe malnutrition in the context of chronic illness per RD note   Please clarify if you agree pt with Severe malnutrition in the context of chronic illness and document in pn or d/c summary   Possible Clinical Conditions?  Severe Malnutrition   Protein Calorie Malnutrition Severe Protein Calorie Malnutrition Emaciation  Cachexia    Other Condition Cannot clinically determine  Supporting Information: Risk Factors:  Height:  Ht Readings from Last 1 Encounters:   11/26/12  5\' 2"  (1.575 m)        Weight:  Wt Readings from Last 1 Encounters:   11/26/12  118 lb 2.7 oz (53.6 kg)    BMI: Body mass index is 21.61 kg/(m^2).   Nutrition Note: 12/02/2012 Inadequate oral intake related to difficulty feeding self as evidenced by patient report and little intake of breakfast.  Signs & Symptoms: GIB ABLA Protein Cal malnutrition Ischemic Heart disease Blood in stools  Diagnostics: Treatment:  Continue bid Prostat and tid Boost  Change diet to Dysphagia 3 with chopped meat  Assistance with meals    Thank You, Enis Slipper ,RN Clinical Documentation Specialist:  [phone]  [facility]- Health Information Management

## 2012-11-28 NOTE — Progress Notes (Addendum)
[facility] PROGRESS NOTE   [name] WUJ:[id number] DOB: Sep 30, 1916 DOA: 11/17/2012 PCP: Michele Mcalpine, MD  Brief narrative: [name] is an 79 y.o. male with a PMH of coronary artery disease for which he typically takes aspirin therapy and osteoarthritis for which he typically takes Mobic, chronic iron deficiency anemia for which he is on chronic iron therapy, who was sent to the ED from his SNF secondary to anemia and Hemoccult-positive and melanotic stools. Is scheduled to undergo upper endoscopy this morning but case was canceled secondary to a blood pressure of 64/31.   Assessment/Plan: Principal Problem:  Acute blood loss anemia / suspected upper GI bleed  Suspect the patient has an upper GI source of bleeding given his chronic treatment with aspirin and Mobic in the setting of melanotic stools. He also has an elevated BUN, consistent with an upper GI source. Given a unit of blood on 11/26/2012 with no improvement in hemoglobin, and 2 additional units 11/11/2012 with an appropriate rise in his hemoglobin. Mobic and aspirin as well as Lasix remains on hold. Continue PPI therapy. Seen by Dr. Christella Hartigan on 11/06/2012 with plans to proceed with upper endoscopy 11/10/2012, but case canceled secondary to low blood pressure.  Plan now is for conservative therapy with empiric PPI, followup H. Pylori serologies (and treat if positive), continue to hold ASA and Mobic.  Diet advanced. Patient noted to have another drop in his hemoglobin this morning. Given recent complaints of chest discomfort, will give an additional unit of blood today (No current complaints of chest pain). Active Problems:  Hypokalemia Potassium added to IV fluids. Coronary artery disease / demand ischemia  Aspirin on hold. Complained of chest pressure 11/26/2012 prompting [place] to cycle troponins. Mild elevation noted, likely from demand ischemia in the setting of anemia.  Pro-BNP elevated.  EF 60-65% on Echo done  01/20/2012. Patient is not a candidate for IV heparin given his GI bleeding, and at his advanced age, conservative therapy is the most appropriate option for him. Unspecified hypothyroidism  Continue home dose of Synthroid.  Severe Protein calorie malnutrition  Seen by dietitian 11/09/2012. Continue boost supplements and protein supplements.  UTI  Continue Ampicillin.   Code Status: DNR Family Communication: [name], [phone], updated by telephone 11/26/12.  Message left today.   Disposition Plan: SNF, from [facility].   IV access:  Peripheral IV  Medical Consultants:  Dr. Rob Bunting, Gastroenterology  Other Consultants:  Dietitian  Anti-infectives:  Rocephin 11/23/2012--->11/26/12  Ampicillin 11/26/12--->  HPI/Subjective: [name] denies current chest pain.  He is taking in small bites of his meals, drinking well.  No shortness of breath.  Nursing staff has not noticed any black or bloody stools.  Objective: Filed Vitals:   11/21/2012 0735 11/16/2012 1500 11/21/2012 2020 11/28/12 0513  BP: 74/33 100/44 93/38 100/41  Pulse:  83 78 73  Temp:  97.8 F (36.6 C) 98.2 F (36.8 C) 97.8 F (36.6 C)  TempSrc: Oral Oral Oral Oral  Resp: 23 20 18 16   Height:      Weight:      SpO2: 96% 98% 94% 96%    Intake/Output Summary (Last 24 hours) at 11/28/12 0817 Last data filed at 11/28/12 0513  Gross per 24 hour  Intake 1719.5 ml  Output   1400 ml  Net  319.5 ml    Exam: Gen:  NAD Cardiovascular:  RRR, No M/R/G Respiratory:  Lungs diminished Gastrointestinal:  Abdomen soft, NT/ND, + BS Extremities:  No C/E/C  Data Reviewed: Basic Metabolic Panel:  Recent Labs Lab 11/24/12 12/02/2012 11/06/2012 1530 11/26/12 0357 11/28/2012 0301  NA 138 139 138 137 138  K 4.4 4.3 4.6 4.0 3.4*  CL  --   --  105 107 107  CO2  --   --  23 22 22   GLUCOSE  --   --  129* 110* 91  BUN 70* 54* 74* 65* 47*  CREATININE 1.5* 1.1 1.16 0.98 0.90  CALCIUM  --   --  8.3*  7.8* 7.8*   GFR Estimated Creatinine Clearance: 38 ml/min (by C-G formula based on Cr of 0.9). Liver Function Tests:  Recent Labs Lab 11/29/2012 1530  AST 13  ALT 8  ALKPHOS 48  BILITOT 0.1*  PROT 5.4*  ALBUMIN 2.3*   Coagulation profile  Recent Labs Lab 11/26/2012 1530  INR 1.13    CBC:  Recent Labs Lab 12/02/2012 1530 11/26/12 0357 11/26/12 2055 11/13/2012 0301 11/28/12 0530  WBC 8.6 10.6* 8.5 8.4 9.9  NEUTROABS 5.3  --   --   --   --   HGB 6.6* 6.6* 9.3* 9.0* 7.2*  HCT 19.9* 18.6* 26.9* 25.2* 20.9*  MCV 90.9 87.7 88.5 88.1 91.7  PLT 276 196 183 169 170   Cardiac Enzymes:  Recent Labs Lab 11/26/12 2055 11/29/2012 0303 11/25/2012 0858 11/28/12 0005 11/28/12 0530  TROPONINI 0.34* 0.32* 0.30* <0.30 <0.30   CBG:  Recent Labs Lab 11/23/2012 0520  GLUCAP 94    Procedures and Diagnostic Studies: Dg Chest 2 View  11/29/2012   CLINICAL DATA:  Blood in stool.  Anemia.  Abdominal pain.  EXAM: CHEST  2 VIEW  COMPARISON:  Chest CT 01/20/2012. Chest x-ray 01/18/2013. Chest x-ray 09/08/2010.  FINDINGS: This was a limited exam is again exam as the patient could not stand out. Exam was performed with the patient supine. Calcified pleural plaques are noted bilaterally. This could be from prior asbestos exposure. Mild atelectasis versus infiltrate right lung base cannot be excluded. No pleural effusion. No pneumothorax. Prior CABG. Pulmonary vascularity is normal. Lucency noted over the anterior abdominal wall on the supine lateral view, this is most likely bowel gas. Abdominal series could be obtained for further evaluation.  IMPRESSION: 1. Mild atelectasis versus infiltrate right lung base. 2. Prior CABG.  No CHF. 3. Calcified pleural plaques, possibly related to prior asbestos exposure.  Lucency noted over the anterior abdomen on supine lateral view. This is most likely bowel. Given patient's history, abdominal series may prove useful for further evaluation.   Electronically Signed    By: [name]  Register   On: 11/13/2012 16:25    Scheduled Meds: . ampicillin (OMNIPEN) IV  500 mg Intravenous Q8H  . docusate sodium  100 mg Oral BID  . feeding supplement (PRO-STAT SUGAR FREE 64)  30 mL Oral BID  . influenza vac split quadrivalent PF  0.5 mL Intramuscular Tomorrow-1000  . lactose free nutrition  237 mL Oral TID BM  . levothyroxine  25 mcg Oral QAC breakfast  . loratadine  10 mg Oral Daily  . pantoprazole (PROTONIX) IV  40 mg Intravenous Q12H  . polyethylene glycol  17 g Oral Daily   Continuous Infusions: . sodium chloride    . 0.9 % NaCl with KCl 20 mEq / L 50 mL/hr at 12/02/2012 0815    Time spent: 25 minutes.    LOS: 3 days   Rambo Sarafian  [facility] Pager [phone].   *Please note that the hospitalists switch teams on Wednesdays.  Please call the flow manager at [phone] if you are having difficulty reaching the hospitalist taking care of this patient as she can update you and provide the most up-to-date pager number of provider caring for the patient. If 8PM-8AM, please contact night-coverage at [email or web address], password [email or web address]  11/28/2012, 8:17 AM

## 2012-11-28 NOTE — Progress Notes (Signed)
CRITICAL VALUE ALERT  Critical value received:  Troponin 0.32  Date of notification:  11/28/2012   Time of notification:  1201  Critical value read back:yes  Nurse who received alert:  Elease Etienne   MD notified (1st page):  Dr. Darnelle Catalan  Time of first page:  12:05 PM   MD notified (2nd page):  Time of second page:  Responding MD:  Dr. Darnelle Catalan  Time MD responded:  12:05 PM

## 2012-11-28 NOTE — Progress Notes (Signed)
Pharmacist Heart Failure Core Measure Documentation  Assessment: [name] has an EF documented as 60-65% on 01/20/12 by Dr. Elease Hashimoto.  Rationale: Heart failure patients with left ventricular systolic dysfunction (LVSD) and an EF < 40% should be prescribed an angiotensin converting enzyme inhibitor (ACEI) or angiotensin receptor blocker (ARB) at discharge unless a contraindication is documented in the medical record.  This patient is not currently on an ACEI or ARB for HF.  This note is being placed in the record in order to provide documentation that a contraindication to the use of these agents is present for this encounter.  ACE Inhibitor or Angiotensin Receptor Blocker is contraindicated (specify all that apply)  []   ACEI allergy AND ARB allergy []   Angioedema []   Moderate or severe aortic stenosis []   Hyperkalemia [x]   Hypotension []   Renal artery stenosis []   Worsening renal function, preexisting renal disease or dysfunction   Laurence Slate 11/28/2012 3:07 PM

## 2012-11-29 DIAGNOSIS — I2510 Atherosclerotic heart disease of native coronary artery without angina pectoris: Secondary | ICD-10-CM

## 2012-11-29 DIAGNOSIS — R578 Other shock: Secondary | ICD-10-CM

## 2012-11-29 LAB — TYPE AND SCREEN
ABO/RH(D): O POS
Unit division: 0

## 2012-11-29 LAB — BASIC METABOLIC PANEL
BUN: 52 mg/dL — ABNORMAL HIGH (ref 6–23)
Chloride: 108 mEq/L (ref 96–112)
GFR calc Af Amer: 83 mL/min — ABNORMAL LOW (ref >90)
Glucose, Bld: 128 mg/dL — ABNORMAL HIGH (ref 70–99)
Potassium: 3.9 mEq/L (ref 3.5–5.1)

## 2012-11-29 LAB — CBC
HCT: 17.7 % — ABNORMAL LOW (ref 39.0–52.0)
Hemoglobin: 6 g/dL — CL (ref 13.0–17.0)
MCH: 31.7 pg (ref 26.0–34.0)
MCHC: 33.9 g/dL (ref 30.0–36.0)
MCV: 93.7 fL (ref 78.0–100.0)
RDW: 16 % — ABNORMAL HIGH (ref 11.5–15.5)
WBC: 15.3 10*3/uL — ABNORMAL HIGH (ref 4.0–10.5)

## 2012-11-29 LAB — PREPARE RBC (CROSSMATCH)

## 2012-11-29 MED ORDER — MORPHINE SULFATE 2 MG/ML IJ SOLN
2.0000 mg | INTRAMUSCULAR | Status: DC | PRN
  Administered 2012-11-29: 2 mg via INTRAVENOUS
  Filled 2012-11-29 (×2): qty 1

## 2012-11-29 MED ORDER — SODIUM CHLORIDE 0.9 % IV BOLUS (SEPSIS)
1000.0000 mL | Freq: Once | INTRAVENOUS | Status: DC
  Administered 2012-11-29: 1000 mL via INTRAVENOUS

## 2012-11-29 NOTE — Progress Notes (Signed)
Patient lethargic this shift. Some small periods of wakefulness where patient will drink some boost. Tolerated blood transfusion well with temporary blood pressure recovery, however patient BP now again severely hypotensive. Patient appears comfortable. No further bloody stools since this am. Frequent visits from family.

## 2012-11-29 NOTE — Progress Notes (Signed)
[facility] PROGRESS NOTE   [name] WUJ:[id number] DOB: 01-06-1916 DOA: 11/26/2012 PCP: Michele Mcalpine, MD  Brief narrative: [name] is an 79 y.o. male with a PMH of coronary artery disease for which he typically takes aspirin therapy and osteoarthritis for which he typically takes Mobic, chronic iron deficiency anemia for which he is on chronic iron therapy, who was sent to the ED from his SNF secondary to anemia and Hemoccult-positive and melanotic stools. Was scheduled to undergo upper endoscopy December 02, 2012 but case was canceled secondary to a blood pressure of 64/31. The patient continues to have ongoing GI bleeding and issues with hypotension. Family is aware of poor prognosis. At this time, they have requested comfort measures only.  Assessment/Plan: Principal Problem:  Acute blood loss anemia / suspected upper GI bleed / hemorrhagic shock  Suspect the patient has an upper GI source of bleeding given his chronic treatment with aspirin and Mobic in the setting of melanotic stools. He also has an elevated BUN, consistent with an upper GI source. He has received a total of 4 units of packed red blood cells with an additional 3 units of blood planned for today. He is currently on the second unit of blood. Mobic and aspirin as well as Lasix remain on hold. Continue PPI therapy. Seen by Dr. Christella Hartigan on 02-Dec-2012 with plans to proceed with upper endoscopy Dec 02, 2012, but case canceled secondary to low blood pressure.  Plan now is for conservative therapy with empiric PPI, followup H. Pylori serologies (and treat if positive), continue to hold ASA and Mobic.  Diet advanced. Family requested supportive care overnight with blood transfusion, but after discussing his care with his son, Gardiner Rhyme, has opted for full comfort measures. We'll discontinue further blood transfusion, no further lab draws, morphine as needed for pain. Active Problems:  Hypokalemia Corrected with potassium added to  IV fluids. Coronary artery disease / demand ischemia  Aspirin on hold. Complained of chest pressure 11/26/2012 prompting [place] to cycle troponins. Mild elevation noted, likely from demand ischemia in the setting of anemia.  Pro-BNP elevated.  EF 60-65% on Echo done 01/20/2012. Patient is not a candidate for IV heparin given his GI bleeding, and at his advanced age, conservative therapy is the most appropriate option for him. Unspecified hypothyroidism  Continue home dose of Synthroid.  Severe Protein calorie malnutrition  Seen by dietitian 12-02-2012. Continue boost supplements and protein supplements.  UTI  Treated with Ampicillin for 3 days.   Code Status: DNR Family Communication: [name], [phone], updated by telephone today.  Significant other (also POA), Peggy, [phone] updated in person & wishes to be called with changes in condition/updates. Disposition Plan: Likely to experience in hospital death.  IV access:  Peripheral IV  Medical Consultants:  Dr. Rob Bunting, Gastroenterology  Other Consultants:  Dietitian  Anti-infectives:  Rocephin 11/23/2012--->11/26/12  Ampicillin 11/26/12---> 11/29/2012  HPI/Subjective: [name] denies current chest pain.  He is resting comfortably. Continues to be alert, requesting sips of cold liquids.  Objective: Filed Vitals:   11/29/12 0412 11/29/12 0444 11/29/12 0533 11/29/12 0705  BP: 50/22  135/109 73/38  Pulse: 79  81 75  Temp: 97.3 F (36.3 C)  96.1 F (35.6 C) 95.9 F (35.5 C)  TempSrc: Oral  Axillary Oral  Resp: 16  33 19  Height:      Weight:      SpO2: 97% 94% 88% 89%    Intake/Output Summary (Last 24 hours) at 11/29/12 0801 Last data filed at  11/29/12 0416  Gross per 24 hour  Intake   1371 ml  Output   1100 ml  Net    271 ml    Exam: Gen:  NAD Cardiovascular:  RRR, No M/R/G Respiratory:  Lungs diminished Gastrointestinal:  Abdomen soft, NT/ND, + BS Extremities:  No C/E/C  Data  Reviewed: Basic Metabolic Panel:  Recent Labs Lab 12/04/2012 11/14/2012 1530 11/26/12 0357 12-06-12 0301 11/29/12 0320  NA 139 138 137 138 138  K 4.3 4.6 4.0 3.4* 3.9  CL  --  105 107 107 108  CO2  --  23 22 22 22   GLUCOSE  --  129* 110* 91 128*  BUN 54* 74* 65* 47* 52*  CREATININE 1.1 1.16 0.98 0.90 0.86  CALCIUM  --  8.3* 7.8* 7.8* 7.5*   GFR Estimated Creatinine Clearance: 39.8 ml/min (by C-G formula based on Cr of 0.86). Liver Function Tests:  Recent Labs Lab 11/07/2012 1530  AST 13  ALT 8  ALKPHOS 48  BILITOT 0.1*  PROT 5.4*  ALBUMIN 2.3*   Coagulation profile  Recent Labs Lab 12/01/2012 1530  INR 1.13    CBC:  Recent Labs Lab 12/01/2012 1530 11/26/12 0357 11/26/12 2055 12-06-12 0301 11/28/12 0530 11/29/12 0320  WBC 8.6 10.6* 8.5 8.4 9.9 15.3*  NEUTROABS 5.3  --   --   --   --   --   HGB 6.6* 6.6* 9.3* 9.0* 7.2* 6.0*  HCT 19.9* 18.6* 26.9* 25.2* 20.9* 17.7*  MCV 90.9 87.7 88.5 88.1 91.7 93.7  PLT 276 196 183 169 170 186   Cardiac Enzymes:  Recent Labs Lab 12/06/2012 0303 2012-12-06 0858 11/28/12 0005 11/28/12 0530 11/28/12 1108  TROPONINI 0.32* 0.30* <0.30 <0.30 0.32*   CBG:  Recent Labs Lab Dec 06, 2012 0520  GLUCAP 94    Procedures and Diagnostic Studies: Dg Chest 2 View  11/16/2012   CLINICAL DATA:  Blood in stool.  Anemia.  Abdominal pain.  EXAM: CHEST  2 VIEW  COMPARISON:  Chest CT 01/20/2012. Chest x-ray 01/18/2013. Chest x-ray 09/08/2010.  FINDINGS: This was a limited exam is again exam as the patient could not stand out. Exam was performed with the patient supine. Calcified pleural plaques are noted bilaterally. This could be from prior asbestos exposure. Mild atelectasis versus infiltrate right lung base cannot be excluded. No pleural effusion. No pneumothorax. Prior CABG. Pulmonary vascularity is normal. Lucency noted over the anterior abdominal wall on the supine lateral view, this is most likely bowel gas. Abdominal series could be  obtained for further evaluation.  IMPRESSION: 1. Mild atelectasis versus infiltrate right lung base. 2. Prior CABG.  No CHF. 3. Calcified pleural plaques, possibly related to prior asbestos exposure.  Lucency noted over the anterior abdomen on supine lateral view. This is most likely bowel. Given patient's history, abdominal series may prove useful for further evaluation.   Electronically Signed   By: [name]  Register   On: 11/27/2012 16:25    Scheduled Meds: . ampicillin (OMNIPEN) IV  500 mg Intravenous Q8H  . docusate sodium  100 mg Oral BID  . feeding supplement (PRO-STAT SUGAR FREE 64)  30 mL Oral BID  . influenza vac split quadrivalent PF  0.5 mL Intramuscular Tomorrow-1000  . lactose free nutrition  237 mL Oral TID BM  . levothyroxine  25 mcg Oral QAC breakfast  . loratadine  10 mg Oral Daily  . pantoprazole (PROTONIX) IV  40 mg Intravenous Q12H  . polyethylene glycol  17 g Oral Daily  .  sodium chloride  1,000 mL Intravenous Once   Continuous Infusions: . sodium chloride    . 0.9 % NaCl with KCl 20 mEq / L 1,000 mL (11/29/12 0342)    Time spent: 35 minutes with > 50% of time discussing current diagnostic test results, clinical impression and plan of care with the son and significant other.    LOS: 4 days   RAMA,CHRISTINA  [facility] Pager [phone].   *Please note that the hospitalists switch teams on Wednesdays. Please call the flow manager at [phone] if you are having difficulty reaching the hospitalist taking care of this patient as she can update you and provide the most up-to-date pager number of provider caring for the patient. If 8PM-8AM, please contact night-coverage at [email or web address], password [facility]  11/29/2012, 8:01 AM

## 2012-11-29 NOTE — Progress Notes (Signed)
Event: Notified by RN that pt has dropped his BP to 50's/20's. He is more lethargic than earlier this evening when he was noted able to hold cup and sip ginger-ale.  She reports pt has had 2 large dark stools that are also noted to have bright red blood in them as well. Lab has just called and hgb is now 6.0 from 7.2 at 0500 yesterday. IVF bolus ordered. NS 500cc bolus ordered. NP to bedside. Subjective: Pt unable to give information d/t decreased LOC. Objective: [name] is an 79 y.o. male with a PMH of CAD for which he typically takes aspirin therapy and osteoarthritis for which he typically takes Mobic, chronic iron deficiency anemia for which he is on chronic iron therapy, who was sent to the ED from his SNF secondary to anemia and Hemoccult-positive and melanotic stools. He was admitted for believed UGI bleed andwas scheduled to undergo scheduled endoscopy yesterday morning but case was canceled secondary to a blood pressure. At bedside pt in NAD but noted very lethargic. Will moan when you call his name but does not offer to open his eyes or speak. Skin cool and dry. Current BP-70/40 after 1st NS 500 cc bolus. Last temp 97.3, P-79, R-16 w/ 02 sats of 94% on VM at 50%. Pt noted to have had a recent large loose stool that is dark and noted w/ bright red blood as well. Record on the morning of 11/28/12 indicated no recent bleeding in 24 hrs. Assessment/Plan: 1. Persistent GI bleed (Upper as well as possibly lower GI).  2. Acute blood loss anemia: As result of # 1. Will transfuse 3 units PRBC's and repeat CBC. 3. Persistent hypotension: As a result of # 1 & 2. Will repeat NS IVF bolus while preparing to give blood.  4. Decreased LOC: As a result of # 3. At the time of this note pt seemed somewhat more responsive.  I have spoken with pt's son Gardiner Rhyme who indicates he is aware that his father will likely not survive this and reiterates that his as well as his father's wishes are to continue our current  treatment in an attempt to increase BP and Hgb but ultimately no "heroic measures" are to be taken. I also spoke w/ Billey Gosling, pt's significant other (and HPOA) and she is aware of pt's decline, confirm's pt's DNR status and will decide if she is able to come to bedside to be with pt. Will continue to monitor closely.  Leanne Chang, NP-C [facility] Pager [phone]

## 2012-11-29 NOTE — Progress Notes (Signed)
Paged NP on call about patient decrease in LOC and low BP. Patient unresponsive at present sats <90%. SBP 50's. Patient had had large melanotic stool few hours ago and now with another. HGB drop to 6 this am. Patient had been just <30 minutes ago sipping on ginger ale at bedside. Patient placed with head of bed completely down and feet up and NS bolus started while paging NP K Schorr. Paced on VM at 50 %

## 2012-11-29 NOTE — Care Management Note (Signed)
   CARE MANAGEMENT NOTE 11/29/2012  Patient:  [name], [name]   Account Number:  [id number]  Date Initiated:  11/29/2012  Documentation initiated by:  Aniayah Alaniz  Subjective/Objective Assessment:   79 yo male admitted with acute blood loss anemia.     Action/Plan:   Home when stable   Anticipated DC Date:     Anticipated DC Plan:    In-house referral  Clinical Social Worker      DC Planning Services  CM consult      Choice offered to / List presented to:  NA   DME arranged  NA      DME agency  NA     HH arranged  NA      HH agency  NA   Status of service:  Completed, signed off Medicare Important Message given?   (If response is "NO", the following Medicare IM given date fields will be blank) Date Medicare IM given:   Date Additional Medicare IM given:    Discharge Disposition:    Per UR Regulation:  Reviewed for med. necessity/level of care/duration of stay  If discussed at Long Length of Stay Meetings, dates discussed:    Comments:  11/29/12 1236 Ruchi Stoney,RN,MSN [phone] Chart reviewed for utilization of services. Pt previosuly from SNF.CSW following.

## 2012-11-29 NOTE — Progress Notes (Signed)
[facility] Progress Note  Subjective:  Having maroon stools this AM, at least two large episodes.  Hgb down significantly this AM.  Hypotensive.  Objective:  Vital signs in last 24 hours: Temp:  [95.9 F (35.5 C)-99.2 F (37.3 C)] 97.3 F (36.3 C) (11/25 0940) Pulse Rate:  [59-112] 84 (11/25 0940) Resp:  [16-33] 24 (11/25 0940) BP: (50-135)/(22-109) 79/37 mmHg (11/25 0940) SpO2:  [88 %-98 %] 90 % (11/25 0905) FiO2 (%):  [50 %] 50 % (11/25 0444) Last BM Date:  (PTA) General:  Alert, frail, elderly in NAD Heart:  Regular rate and rhythm Pulm:  CTAB.  No W/R/R. Abdomen:  Soft, non-tender and non-distended.  Bowel sounds present. Ext:  No edema noted.  Intake/Output from previous day: 11/24 0701 - 11/25 0700 In: 1371 [P.O.:560; I.V.:456; Blood:355] Out: 1100 [Urine:1100] Intake/Output this shift: Total I/O In: 120 [P.O.:120] Out: -   Lab Results:  Recent Labs  10-Dec-2012 0301 11/28/12 0530 11/29/12 0320  WBC 8.4 9.9 15.3*  HGB 9.0* 7.2* 6.0*  HCT 25.2* 20.9* 17.7*  PLT 169 170 186   BMET  Recent Labs  12-10-12 0301 11/29/12 0320  NA 138 138  K 3.4* 3.9  CL 107 108  CO2 22 22  GLUCOSE 91 128*  BUN 47* 52*  CREATININE 0.90 0.86  CALCIUM 7.8* 7.5*   Assessment / Plan: -Likely UGI bleed while on ASA and Mobic.  -ABLA: Baseline Hgb in the 11 and 12 gram range. He is receiving his 6th unit of PRBC's this admission. Hgb is down significantly this AM despite unit of blood yesterday.  Having dark maroon colored stools this AM. -Hypotension:  Persistent despite NS IV bolus.  *Continue BID PPI.  *I spoke with Dr. Darnelle Catalan and she is going to touch base with patient's son to decide on endoscopic evaluation vs transition to comfort care.  From note early this AM there was no plan for "heroic measures" and son was aware that his father may not survive.    LOS: 4 days   ZEHR, JESSICA D.  11/29/2012, 9:47 AM  Pager number [phone]    Attending physician's  note   I have taken an interval history, reviewed the chart and examined the patient. I agree with the Advanced Practitioner's note, impression and recommendations.  Recurrent bleeding, hypotension and Hb drop. Troponin elevatd at 0.32. Family is not currently interested in EGD and other measures to treat his bleed. They are leaning toward comfort care. If they decide on EGD he will need to substantially improve hemodynamically to proceed.   Venita Lick. Russella Dar, MD [facility]

## 2012-11-29 NOTE — Progress Notes (Signed)
Clinical Social Work Department BRIEF PSYCHOSOCIAL ASSESSMENT 11/29/2012  Patient:  [name], [name]     Account Number:  [id number]     Admit date:  11/17/2012  Clinical Social Worker:  Jacelyn Grip  Date/Time:  11/29/2012 03:00 PM  Referred by:  Care Management  Date Referred:  11/29/2012 Referred for  SNF Placement   Other Referral:   Patient admitted from [facility]   Interview type:  Other - See comment Other interview type:   chart review and discussion with medical team    PSYCHOSOCIAL DATA Living Status:  FACILITY Admitted from facility:  [facility] Level of care:  Skilled Nursing Facility Primary support name:  [name] Primary support relationship to patient:  CHILD, ADULT Degree of support available:   adequate per report    CURRENT CONCERNS Current Concerns  Post-Acute Placement   Other Concerns:    SOCIAL WORK ASSESSMENT / PLAN CSW received notification that pt admitted from [facility].    CSW reviewed chart and noted that pt has transitioned to full comfort care. Per MD note, anticipate pt will have hospital death.    CSW notified [facility] and will continue to update facility on pt status.    CSW will continue to follow and provide support as appropriate to pt family.   Assessment/plan status:  Psychosocial Support/Ongoing Assessment of Needs Other assessment/ plan:   discharge planning   Information/referral to community resources:   pending at this time as pt is anticipated to have an in-hospital death    PATIENT'S/FAMILY'S RESPONSE TO PLAN OF CARE: Per discussion with RN, pt has had frequent family visits and family is supportive and appear to be coping well at this time. CSW will remain available for support as needed.    Jacklynn Lewis, MSW, LCSWA  Clinical Social Work [phone]

## 2012-11-29 NOTE — Progress Notes (Signed)
Spoke to Son IRA by phone about change in condition.

## 2012-11-30 DIAGNOSIS — D126 Benign neoplasm of colon, unspecified: Secondary | ICD-10-CM

## 2012-11-30 DIAGNOSIS — R109 Unspecified abdominal pain: Secondary | ICD-10-CM

## 2012-12-03 LAB — TYPE AND SCREEN
Antibody Screen: NEGATIVE
Unit division: 0

## 2012-12-05 NOTE — Progress Notes (Signed)
Pronounced patient at 1235 with Vivi Ferns RN, no respirations and no heart beat.

## 2012-12-05 NOTE — Progress Notes (Signed)
11/10/2012 1215 Walked in patients room, patient appeared to be asleep. Breaths short/hallow. Tech was in room getting things ready to bathe patient. 1220 adjusted patients bed to about 45degrees, patient started bleeding from nose and mouth. BP 55/25 Rapid response RN called. RR RN came to assess patient. Called patients girlfriend and son. 1235 patient expired.  Two nurse assess and pronounced patient Addison Naegeli and Arline Asp, Charity fundraiser.

## 2012-12-05 NOTE — Progress Notes (Signed)
No stools since am on 11/25

## 2012-12-05 NOTE — Discharge Summary (Signed)
  Death Summary  [name] HYQ:[id number] DOB: 05/13/1916 DOA: 12-10-2012  PCP: Michele Mcalpine, MD PCP/Office notified  Admit date: 10-Dec-2012 Date of Death: 12/15/2012  Final Diagnoses:  Principal Problem:   Acute blood loss anemia Active Problems:   Coronary artery disease   Unspecified hypothyroidism   Severe protein-calorie malnutrition   UTI (urinary tract infection)   Demand ischemia   Hemorrhagic shock  History of present illness:  79 y.o. male with a PMH of coronary artery disease for which he typically takes aspirin therapy and osteoarthritis for which he typically takes Mobic, chronic iron deficiency anemia for which he is on chronic iron therapy, who was sent to the ED from his SNF secondary to anemia and Hemoccult-positive and melanotic stools. Was scheduled to undergo upper endoscopy 12-12-12 but case was canceled secondary to a blood pressure of 64/31. The patient continued to have ongoing GI bleeding and issues with hypotension. Family requested comfort care at this time.   Assessment/Plan:   Principal Problem:  Acute blood loss anemia / suspected upper GI bleed / hemorrhagic shock  - Suspected the patient had an upper GI source of bleeding given the chronic treatment with aspirin and Mobic in the setting of melanotic stools. He also had an elevated BUN, consistent with an upper GI source. He has received a total of 7 units of packed red blood cells.  - Seen by Dr. Christella Hartigan on 2012/12/12 with plans to proceed with upper endoscopy 12/12/2012, but case canceled secondary to low blood pressure. Plan was then conservative treatment and comfort care   Active Problems:  Hypokalemia  - Corrected with potassium added to IV fluids.  Coronary artery disease / demand ischemia  - aspirin was on hold.  - EF 60-65% on Echo done 01/20/2012. Patient was not a candidate for IV heparin given GI bleeding, and at his advanced age, conservative therapy was the most appropriate option  for him.  Unspecified hypothyroidism  - was on home dose of Synthroid.  Severe Protein calorie malnutrition  - Seen by dietitian 12-12-2012.   UTI  - Treated with Ampicillin for 3 days.   Code Status: DNR  Family Communication: family not at the bedside    IV access:  Peripheral IV Medical Consultants:  Dr. Rob Bunting, Gastroenterology Other Consultants:  Dietitian Anti-infectives:  Rocephin 11/23/2012--->11/26/12  Ampicillin 11/26/12---> 11/29/2012   Time: 13:30  2012-12-15  Signed:  Manson Passey  [facility] 2012/12/15, 1:43 PM

## 2012-12-05 DEATH — deceased

## 2012-12-26 ENCOUNTER — Telehealth: Payer: Self-pay

## 2012-12-26 NOTE — Telephone Encounter (Signed)
Patient past away per Obituary in [place] [employer] °

## 2014-11-15 NOTE — Progress Notes (Signed)
This encounter was created in error - please disregard.

## 2014-11-17 IMAGING — CR DG CHEST 2V
3 series · 3 of 3 positions shown · non-contrast
Comparison: Chest CT 01/20/2012. Chest x-ray 01/18/2013. Chest
x-ray 09/08/2010.

CLINICAL DATA: Blood in stool.  Anemia.  Abdominal pain.

EXAM:
CHEST  2 VIEW

[w chest lat (1 of 2)]
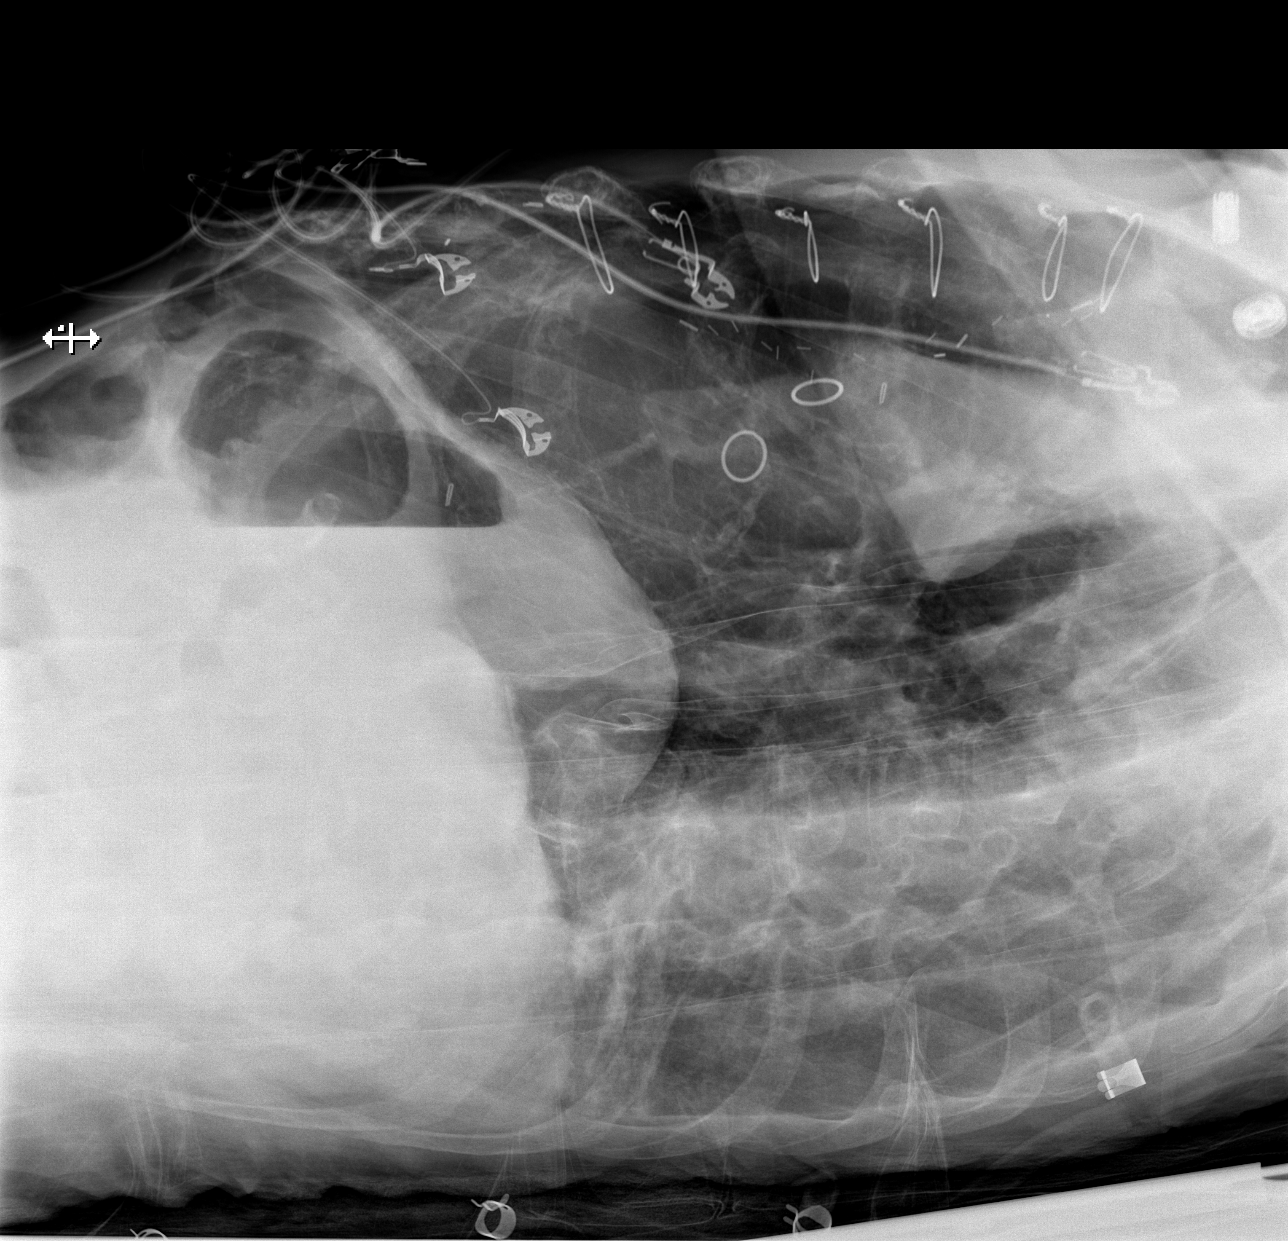

[w chest lat (2 of 2)]
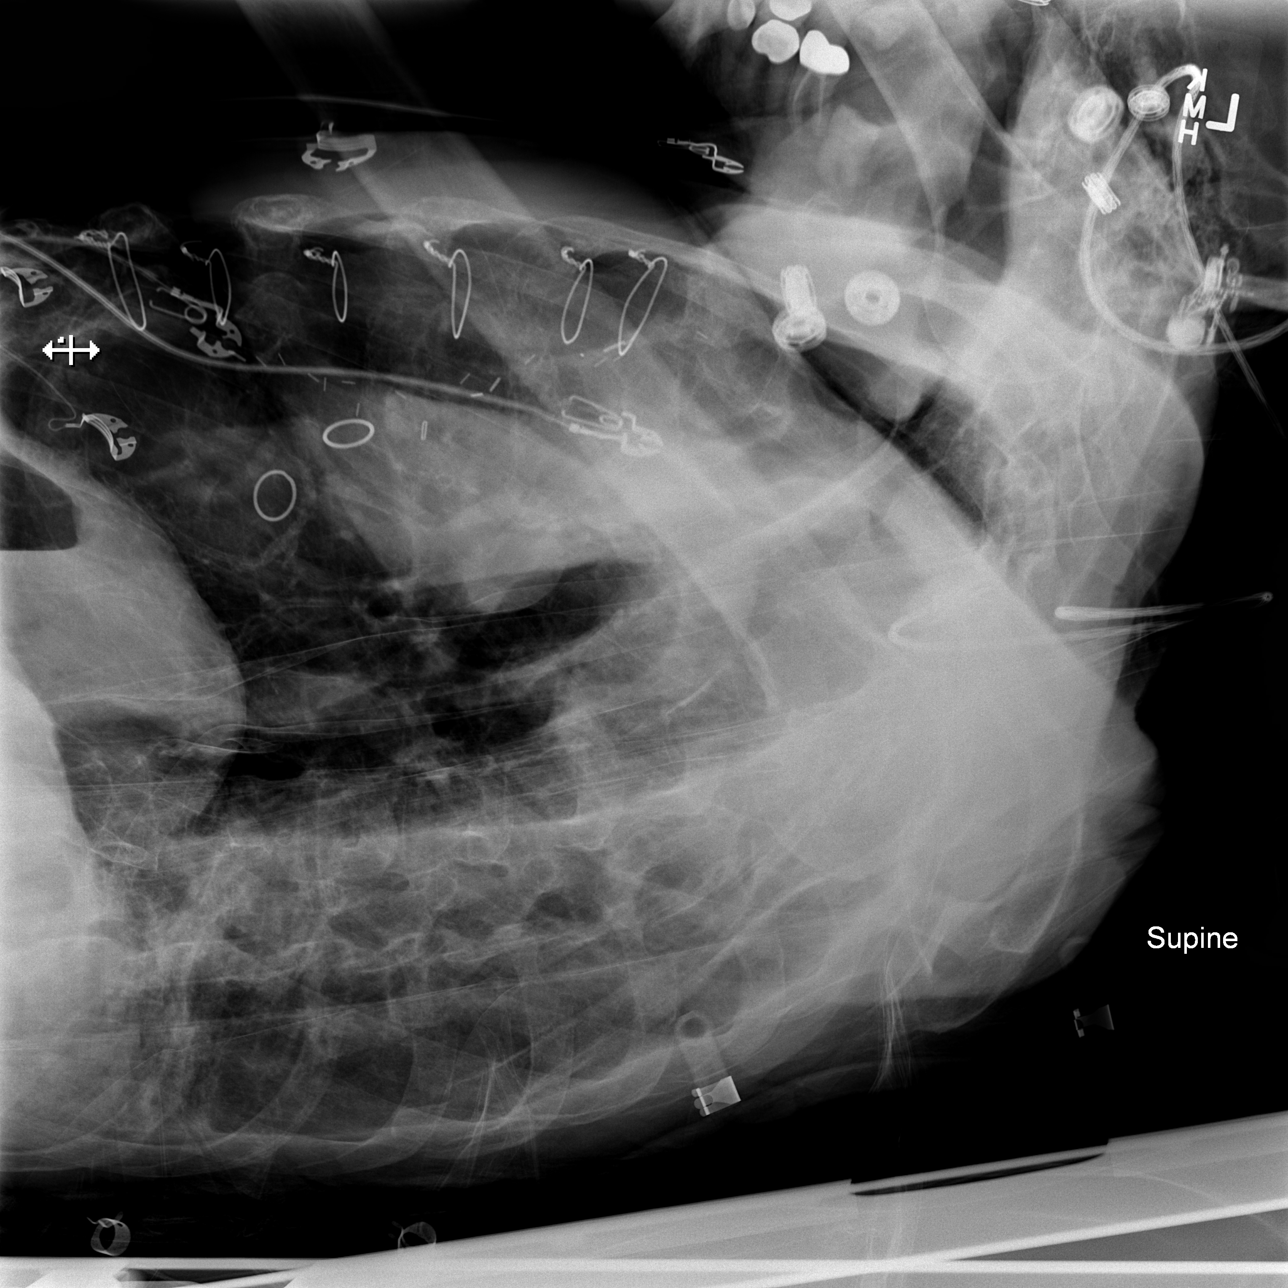

[x chest ap]
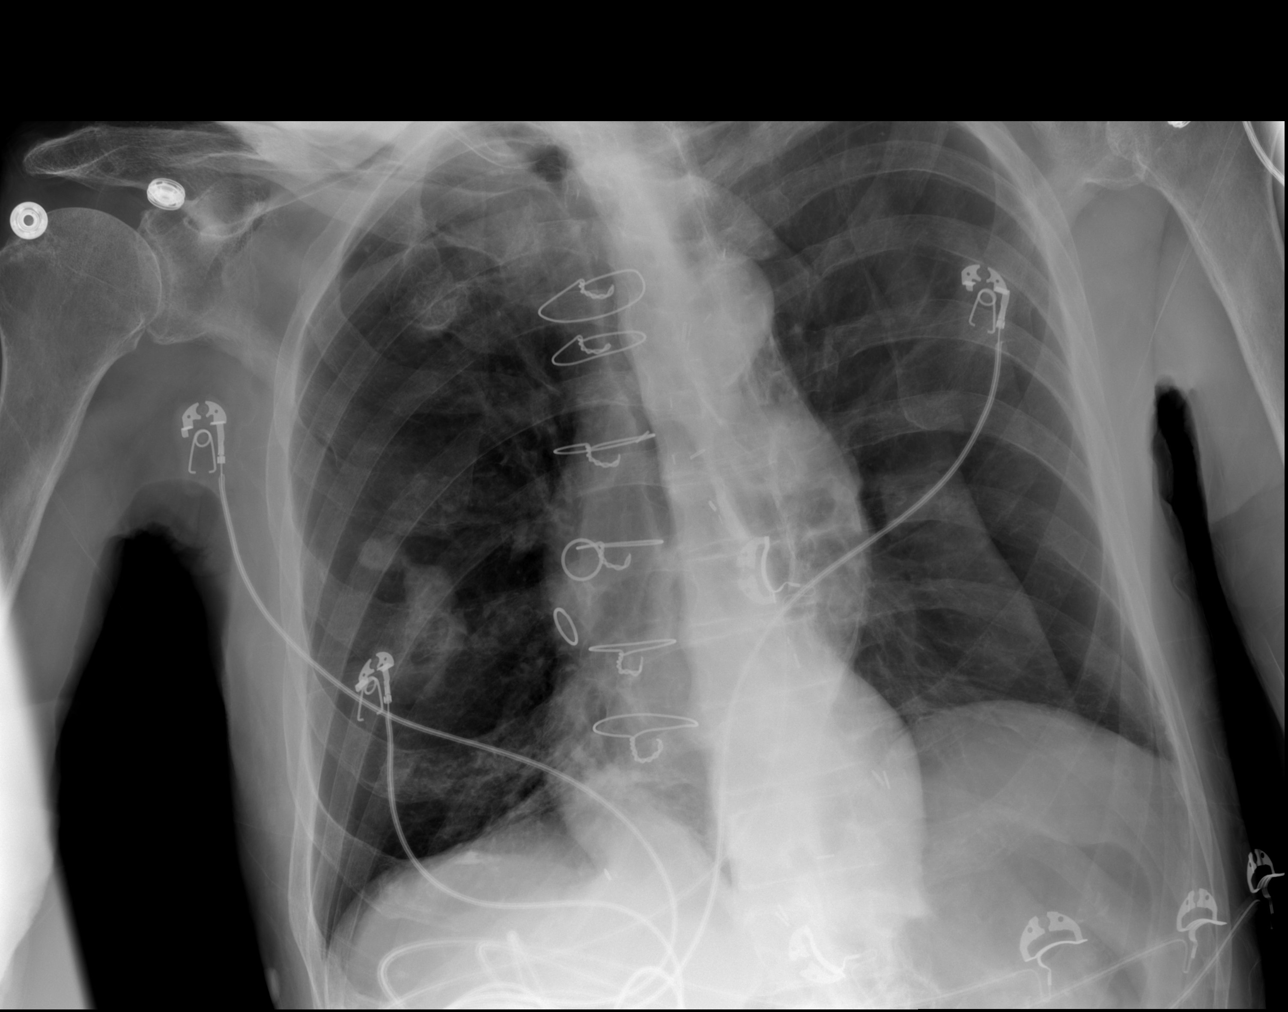

[3 of 3 positions shown; findings below may reference images not displayed]

FINDINGS: This was a limited exam is again exam as the patient could not stand
out. Exam was performed with the patient supine. Calcified pleural
plaques are noted bilaterally. This could be from prior asbestos
exposure. Mild atelectasis versus infiltrate right lung base cannot
be excluded. No pleural effusion. No pneumothorax. Prior CABG.
Pulmonary vascularity is normal. Lucency noted over the anterior
abdominal wall on the supine lateral view, this is most likely bowel
gas. Abdominal series could be obtained for further evaluation.
IMPRESSION: 1. Mild atelectasis versus infiltrate right lung base.
2. Prior CABG.  No CHF.
3. Calcified pleural plaques, possibly related to prior asbestos
exposure.

Lucency noted over the anterior abdomen on supine lateral view. This
is most likely bowel. Given patient's history, abdominal series may
prove useful for further evaluation.
